# Patient Record
Sex: Female | Born: 1960 | Race: White | Hispanic: No | Marital: Married | Smoking: Never smoker
Health system: Southern US, Community
[De-identification: ages and names within clinical notes are randomized; demographics above are authoritative.]

## PROBLEM LIST (undated history)

## (undated) DIAGNOSIS — E228 Other hyperfunction of pituitary gland: Secondary | ICD-10-CM

## (undated) DIAGNOSIS — E221 Hyperprolactinemia: Secondary | ICD-10-CM

## (undated) DIAGNOSIS — I4719 Other supraventricular tachycardia: Secondary | ICD-10-CM

## (undated) DIAGNOSIS — C4491 Basal cell carcinoma of skin, unspecified: Secondary | ICD-10-CM

## (undated) DIAGNOSIS — Z8249 Family history of ischemic heart disease and other diseases of the circulatory system: Secondary | ICD-10-CM

## (undated) DIAGNOSIS — I471 Supraventricular tachycardia: Secondary | ICD-10-CM

## (undated) DIAGNOSIS — I493 Ventricular premature depolarization: Secondary | ICD-10-CM

## (undated) DIAGNOSIS — E7800 Pure hypercholesterolemia, unspecified: Secondary | ICD-10-CM

## (undated) HISTORY — DX: Family history of ischemic heart disease and other diseases of the circulatory system: Z82.49

## (undated) HISTORY — DX: Supraventricular tachycardia: I47.1

## (undated) HISTORY — PX: OTHER SURGICAL HISTORY: SHX169

## (undated) HISTORY — DX: Pure hypercholesterolemia, unspecified: E78.00

## (undated) HISTORY — DX: Other supraventricular tachycardia: I47.19

## (undated) HISTORY — DX: Other hyperfunction of pituitary gland: E22.8

## (undated) HISTORY — DX: Hyperprolactinemia: E22.1

## (undated) HISTORY — DX: Ventricular premature depolarization: I49.3

## (undated) HISTORY — PX: BASAL CELL CARCINOMA EXCISION: SHX1214

## (undated) HISTORY — DX: Basal cell carcinoma of skin, unspecified: C44.91

---

## 2001-10-16 ENCOUNTER — Encounter: Admission: RE | Admit: 2001-10-16 | Discharge: 2001-10-16 | Payer: Self-pay | Admitting: Family Medicine

## 2001-10-16 ENCOUNTER — Encounter: Payer: Self-pay | Admitting: Family Medicine

## 2002-03-19 ENCOUNTER — Encounter: Payer: Self-pay | Admitting: Family Medicine

## 2002-03-19 ENCOUNTER — Ambulatory Visit (HOSPITAL_COMMUNITY): Admission: RE | Admit: 2002-03-19 | Discharge: 2002-03-19 | Payer: Self-pay | Admitting: Family Medicine

## 2002-04-30 ENCOUNTER — Encounter: Payer: Self-pay | Admitting: Family Medicine

## 2002-04-30 ENCOUNTER — Encounter: Admission: RE | Admit: 2002-04-30 | Discharge: 2002-04-30 | Payer: Self-pay | Admitting: Family Medicine

## 2002-11-19 ENCOUNTER — Encounter: Admission: RE | Admit: 2002-11-19 | Discharge: 2002-11-19 | Payer: Self-pay | Admitting: Family Medicine

## 2002-11-19 ENCOUNTER — Encounter: Payer: Self-pay | Admitting: Family Medicine

## 2003-12-15 ENCOUNTER — Encounter: Admission: RE | Admit: 2003-12-15 | Discharge: 2003-12-15 | Payer: Self-pay | Admitting: Family Medicine

## 2003-12-23 ENCOUNTER — Other Ambulatory Visit: Admission: RE | Admit: 2003-12-23 | Discharge: 2003-12-23 | Payer: Self-pay | Admitting: Family Medicine

## 2004-07-05 ENCOUNTER — Encounter: Admission: RE | Admit: 2004-07-05 | Discharge: 2004-07-05 | Payer: Self-pay | Admitting: Family Medicine

## 2005-01-25 ENCOUNTER — Encounter: Admission: RE | Admit: 2005-01-25 | Discharge: 2005-01-25 | Payer: Self-pay | Admitting: Family Medicine

## 2006-03-07 ENCOUNTER — Encounter: Admission: RE | Admit: 2006-03-07 | Discharge: 2006-03-07 | Payer: Self-pay | Admitting: Family Medicine

## 2006-04-09 ENCOUNTER — Encounter: Admission: RE | Admit: 2006-04-09 | Discharge: 2006-04-09 | Payer: Self-pay | Admitting: Family Medicine

## 2007-04-03 ENCOUNTER — Encounter: Admission: RE | Admit: 2007-04-03 | Discharge: 2007-04-03 | Payer: Self-pay | Admitting: Family Medicine

## 2007-08-05 ENCOUNTER — Other Ambulatory Visit: Admission: RE | Admit: 2007-08-05 | Discharge: 2007-08-05 | Payer: Self-pay | Admitting: Family Medicine

## 2007-09-04 ENCOUNTER — Encounter: Admission: RE | Admit: 2007-09-04 | Discharge: 2007-09-04 | Payer: Self-pay | Admitting: Family Medicine

## 2008-09-16 ENCOUNTER — Other Ambulatory Visit: Admission: RE | Admit: 2008-09-16 | Discharge: 2008-09-16 | Payer: Self-pay | Admitting: Family Medicine

## 2008-09-28 ENCOUNTER — Encounter: Admission: RE | Admit: 2008-09-28 | Discharge: 2008-09-28 | Payer: Self-pay | Admitting: Family Medicine

## 2009-12-08 ENCOUNTER — Encounter: Admission: RE | Admit: 2009-12-08 | Discharge: 2009-12-08 | Payer: Self-pay | Admitting: Family Medicine

## 2010-01-05 ENCOUNTER — Other Ambulatory Visit: Admission: RE | Admit: 2010-01-05 | Discharge: 2010-01-05 | Payer: Self-pay | Admitting: Family Medicine

## 2010-02-23 ENCOUNTER — Encounter: Admission: RE | Admit: 2010-02-23 | Discharge: 2010-02-23 | Payer: Self-pay | Admitting: Family Medicine

## 2010-05-19 ENCOUNTER — Encounter: Payer: Self-pay | Admitting: Family Medicine

## 2010-05-20 ENCOUNTER — Encounter: Payer: Self-pay | Admitting: Family Medicine

## 2012-11-02 ENCOUNTER — Other Ambulatory Visit: Payer: Self-pay

## 2012-11-02 DIAGNOSIS — Z1231 Encounter for screening mammogram for malignant neoplasm of breast: Secondary | ICD-10-CM

## 2012-11-06 ENCOUNTER — Other Ambulatory Visit (HOSPITAL_COMMUNITY)
Admission: RE | Admit: 2012-11-06 | Discharge: 2012-11-06 | Disposition: A | Discharge status: Home / Self Care | Payer: BC Managed Care – PPO | Source: Ambulatory Visit | Attending: Family Medicine | Admitting: Family Medicine

## 2012-11-06 ENCOUNTER — Other Ambulatory Visit: Payer: Self-pay | Admitting: Family Medicine

## 2012-11-06 DIAGNOSIS — Z124 Encounter for screening for malignant neoplasm of cervix: Secondary | ICD-10-CM | POA: Insufficient documentation

## 2012-11-19 ENCOUNTER — Ambulatory Visit: Payer: Self-pay

## 2013-09-03 ENCOUNTER — Ambulatory Visit
Admission: RE | Admit: 2013-09-03 | Discharge: 2013-09-03 | Disposition: A | Discharge status: Home / Self Care | Payer: 59 | Source: Ambulatory Visit

## 2013-09-03 ENCOUNTER — Encounter (INDEPENDENT_AMBULATORY_CARE_PROVIDER_SITE_OTHER): Payer: Self-pay

## 2013-09-03 DIAGNOSIS — Z1231 Encounter for screening mammogram for malignant neoplasm of breast: Secondary | ICD-10-CM

## 2013-10-01 ENCOUNTER — Other Ambulatory Visit: Payer: Self-pay | Admitting: Dermatology

## 2015-06-29 ENCOUNTER — Emergency Department (INDEPENDENT_AMBULATORY_CARE_PROVIDER_SITE_OTHER)
Admission: EM | Admit: 2015-06-29 | Discharge: 2015-06-29 | Disposition: A | Discharge status: Home / Self Care | Payer: 59 | Source: Home / Self Care | Attending: Family Medicine | Admitting: Family Medicine

## 2015-06-29 ENCOUNTER — Encounter (HOSPITAL_COMMUNITY): Payer: Self-pay | Admitting: *Deleted

## 2015-06-29 DIAGNOSIS — S93402A Sprain of unspecified ligament of left ankle, initial encounter: Secondary | ICD-10-CM | POA: Diagnosis not present

## 2015-06-29 NOTE — ED Provider Notes (Signed)
CSN: [id number]     Arrival date & time 06/29/15  1833 History   First MD Initiated Contact with Patient 06/29/15 1948     Chief Complaint  Patient presents with  . Ankle Injury   (Consider location/radiation/quality/duration/timing/severity/associated sxs/prior Treatment) Patient is a 55 y.o. female presenting with lower extremity injury. The history is provided by the patient and the spouse.  Ankle Injury This is a new problem. The current episode started 3 to 5 hours ago. The problem has been gradually improving.    History reviewed. No pertinent past medical history. History reviewed. No pertinent past surgical history. History reviewed. No pertinent family history. Social History  Substance Use Topics  . Smoking status: None  . Smokeless tobacco: None  . Alcohol Use: No   OB History    No data available     Review of Systems  Constitutional: Negative.   Gastrointestinal: Negative.   Musculoskeletal: Positive for joint swelling. Negative for back pain and gait problem.  Skin: Negative.   All other systems reviewed and are negative.   Allergies  Review of patient's allergies indicates no known allergies.  Home Medications   Prior to Admission medications   Medication Sig Start Date End Date Taking? Authorizing Provider  Ibuprofen (MOTRIN PO) Take by mouth.   Yes Historical Provider, MD   Meds Ordered and Administered this Visit  Medications - No data to display  BP 100/68 mmHg  Pulse 65  Temp(Src) 99.9 F (37.7 C) (Oral)  Resp 12  SpO2 99% No data found.   Physical Exam  Constitutional: She is oriented to person, place, and time. She appears well-developed and well-nourished. No distress.  Musculoskeletal: Normal range of motion. She exhibits tenderness.       Left ankle: She exhibits swelling. She exhibits normal range of motion, no ecchymosis, no deformity and normal pulse. Tenderness. Lateral malleolus tenderness found. No medial malleolus, no head of  5th metatarsal and no proximal fibula tenderness found. Achilles tendon normal.       Feet:  Neurological: She is alert and oriented to person, place, and time.  Skin: Skin is warm.  Nursing note and vitals reviewed.   ED Course  Procedures (including critical care time)  Labs Review Labs Reviewed - No data to display  Imaging Review No results found.   Visual Acuity Review  Right Eye Distance:   Left Eye Distance:   Bilateral Distance:    Right Eye Near:   Left Eye Near:    Bilateral Near:         MDM   1. Ankle sprain, left, initial encounter        Billy Fischer, MD 07/02/15 1324

## 2015-06-29 NOTE — ED Notes (Addendum)
Pt  Reports   She    Sustained  An  Injury  To   Her l  Ankle    Today at  approx    5:15 today   She       Twisted  The  Ankle  While  Going  Down  Steps            She  Has  Been applying  Ice  To the  Affected  Ankle          Pt   denys  Any   Other  Injury

## 2015-06-29 NOTE — Discharge Instructions (Signed)
Wear ankle support as needed for comfort, activity as tolerated. advil and ice Acute Ankle Sprain With Phase I Rehab An acute ankle sprain is a partial or complete tear in one or more of the ligaments of the ankle due to traumatic injury. The severity of the injury depends on both the number of ligaments sprained and the grade of sprain. There are 3 grades of sprains.   A grade 1 sprain is a mild sprain. There is a slight pull without obvious tearing. There is no loss of strength, and the muscle and ligament are the correct length.  A grade 2 sprain is a moderate sprain. There is tearing of fibers within the substance of the ligament where it connects two bones or two cartilages. The length of the ligament is increased, and there is usually decreased strength.  A grade 3 sprain is a complete rupture of the ligament and is uncommon. In addition to the grade of sprain, there are three types of ankle sprains.  Lateral ankle sprains: This is a sprain of one or more of the three ligaments on the outer side (lateral) of the ankle. These are the most common sprains. Medial ankle sprains: There is one large triangular ligament of the inner side (medial) of the ankle that is susceptible to injury. Medial ankle sprains are less common. Syndesmosis, "high ankle," sprains: The syndesmosis is the ligament that connects the two bones of the lower leg. Syndesmosis sprains usually only occur with very severe ankle sprains. SYMPTOMS  Pain, tenderness, and swelling in the ankle, starting at the side of injury that may progress to the whole ankle and foot with time.  "Pop" or tearing sensation at the time of injury.  Bruising that may spread to the heel.  Impaired ability to walk soon after injury. CAUSES   Acute ankle sprains are caused by trauma placed on the ankle that temporarily forces or pries the anklebone (talus) out of its normal socket.  Stretching or tearing of the ligaments that normally hold the  joint in place (usually due to a twisting injury). RISK INCREASES WITH:  Previous ankle sprain.  Sports in which the foot may land awkwardly (i.e., basketball, volleyball, or soccer) or walking or running on uneven or rough surfaces.  Shoes with inadequate support to prevent sideways motion when stress occurs.  Poor strength and flexibility.  Poor balance skills.  Contact sports. PREVENTION   Warm up and stretch properly before activity.  Maintain physical fitness:  Ankle and leg flexibility, muscle strength, and endurance.  Cardiovascular fitness.  Balance training activities.  Use proper technique and have a coach correct improper technique.  Taping, protective strapping, bracing, or high-top tennis shoes may help prevent injury. Initially, tape is best; however, it loses most of its support function within 10 to 15 minutes.  Wear proper-fitted protective shoes (High-top shoes with taping or bracing is more effective than either alone).  Provide the ankle with support during sports and practice activities for 12 months following injury. PROGNOSIS   If treated properly, ankle sprains can be expected to recover completely; however, the length of recovery depends on the degree of injury.  A grade 1 sprain usually heals enough in 5 to 7 days to allow modified activity and requires an average of 6 weeks to heal completely.  A grade 2 sprain requires 6 to 10 weeks to heal completely.  A grade 3 sprain requires 12 to 16 weeks to heal.  A syndesmosis sprain often takes more than  3 months to heal. RELATED COMPLICATIONS   Frequent recurrence of symptoms may result in a chronic problem. Appropriately addressing the problem the first time decreases the frequency of recurrence and optimizes healing time. Severity of the initial sprain does not predict the likelihood of later instability.  Injury to other structures (bone, cartilage, or tendon).  A chronically unstable or  arthritic ankle joint is a possibility with repeated sprains. TREATMENT Treatment initially involves the use of ice, medication, and compression bandages to help reduce pain and inflammation. Ankle sprains are usually immobilized in a walking cast or boot to allow for healing. Crutches may be recommended to reduce pressure on the injury. After immobilization, strengthening and stretching exercises may be necessary to regain strength and a full range of motion. Surgery is rarely needed to treat ankle sprains. MEDICATION   Nonsteroidal anti-inflammatory medications, such as aspirin and ibuprofen (do not take for the first 3 days after injury or within 7 days before surgery), or other minor pain relievers, such as acetaminophen, are often recommended. Take these as directed by your caregiver. Contact your caregiver immediately if any bleeding, stomach upset, or signs of an allergic reaction occur from these medications.  Ointments applied to the skin may be helpful.  Pain relievers may be prescribed as necessary by your caregiver. Do not take prescription pain medication for longer than 4 to 7 days. Use only as directed and only as much as you need. HEAT AND COLD  Cold treatment (icing) is used to relieve pain and reduce inflammation for acute and chronic cases. Cold should be applied for 10 to 15 minutes every 2 to 3 hours for inflammation and pain and immediately after any activity that aggravates your symptoms. Use ice packs or an ice massage.  Heat treatment may be used before performing stretching and strengthening activities prescribed by your caregiver. Use a heat pack or a warm soak. SEEK IMMEDIATE MEDICAL CARE IF:   Pain, swelling, or bruising worsens despite treatment.  You experience pain, numbness, discoloration, or coldness in the foot or toes.  New, unexplained symptoms develop (drugs used in treatment may produce side effects.) EXERCISES  PHASE I EXERCISES RANGE OF MOTION (ROM)  AND STRETCHING EXERCISES - Ankle Sprain, Acute Phase I, Weeks 1 to 2 These exercises may help you when beginning to restore flexibility in your ankle. You will likely work on these exercises for the 1 to 2 weeks after your injury. Once your physician, physical therapist, or athletic trainer sees adequate progress, he or she will advance your exercises. While completing these exercises, remember:   Restoring tissue flexibility helps normal motion to return to the joints. This allows healthier, less painful movement and activity.  An effective stretch should be held for at least 30 seconds.  A stretch should never be painful. You should only feel a gentle lengthening or release in the stretched tissue. RANGE OF MOTION - Dorsi/Plantar Flexion  While sitting with your right / left knee straight, draw the top of your foot upwards by flexing your ankle. Then reverse the motion, pointing your toes downward.  Hold each position for __________ seconds.  After completing your first set of exercises, repeat this exercise with your knee bent. Repeat __________ times. Complete this exercise __________ times per day.  RANGE OF MOTION - Ankle Alphabet  Imagine your right / left big toe is a pen.  Keeping your hip and knee still, write out the entire alphabet with your "pen." Make the letters as large as  you can without increasing any discomfort. Repeat __________ times. Complete this exercise __________ times per day.  STRENGTHENING EXERCISES - Ankle Sprain, Acute -Phase I, Weeks 1 to 2 These exercises may help you when beginning to restore strength in your ankle. You will likely work on these exercises for 1 to 2 weeks after your injury. Once your physician, physical therapist, or athletic trainer sees adequate progress, he or she will advance your exercises. While completing these exercises, remember:   Muscles can gain both the endurance and the strength needed for everyday activities through controlled  exercises.  Complete these exercises as instructed by your physician, physical therapist, or athletic trainer. Progress the resistance and repetitions only as guided.  You may experience muscle soreness or fatigue, but the pain or discomfort you are trying to eliminate should never worsen during these exercises. If this pain does worsen, stop and make certain you are following the directions exactly. If the pain is still present after adjustments, discontinue the exercise until you can discuss the trouble with your clinician. STRENGTH - Dorsiflexors  Secure a rubber exercise band/tubing to a fixed object (i.e., table, pole) and loop the other end around your right / left foot.  Sit on the floor facing the fixed object. The band/tubing should be slightly tense when your foot is relaxed.  Slowly draw your foot back toward you using your ankle and toes.  Hold this position for __________ seconds. Slowly release the tension in the band and return your foot to the starting position. Repeat __________ times. Complete this exercise __________ times per day.  STRENGTH - Plantar-flexors   Sit with your right / left leg extended. Holding onto both ends of a rubber exercise band/tubing, loop it around the ball of your foot. Keep a slight tension in the band.  Slowly push your toes away from you, pointing them downward.  Hold this position for __________ seconds. Return slowly, controlling the tension in the band/tubing. Repeat __________ times. Complete this exercise __________ times per day.  STRENGTH - Ankle Eversion  Secure one end of a rubber exercise band/tubing to a fixed object (table, pole). Loop the other end around your foot just before your toes.  Place your fists between your knees. This will focus your strengthening at your ankle.  Drawing the band/tubing across your opposite foot, slowly, pull your little toe out and up. Make sure the band/tubing is positioned to resist the entire  motion.  Hold this position for __________ seconds. Have your muscles resist the band/tubing as it slowly pulls your foot back to the starting position.  Repeat __________ times. Complete this exercise __________ times per day.  STRENGTH - Ankle Inversion  Secure one end of a rubber exercise band/tubing to a fixed object (table, pole). Loop the other end around your foot just before your toes.  Place your fists between your knees. This will focus your strengthening at your ankle.  Slowly, pull your big toe up and in, making sure the band/tubing is positioned to resist the entire motion.  Hold this position for __________ seconds.  Have your muscles resist the band/tubing as it slowly pulls your foot back to the starting position. Repeat __________ times. Complete this exercises __________ times per day.  STRENGTH - Towel Curls  Sit in a chair positioned on a non-carpeted surface.  Place your right / left foot on a towel, keeping your heel on the floor.  Pull the towel toward your heel by only curling your toes. Keep  your heel on the floor.  If instructed by your physician, physical therapist, or athletic trainer, add weight to the end of the towel. Repeat __________ times. Complete this exercise __________ times per day.   This information is not intended to replace advice given to you by your health care provider. Make sure you discuss any questions you have with your health care provider.   Document Released: 11/14/2004 Document Revised: 05/06/2014 Document Reviewed: 07/28/2008 Elsevier Interactive Patient Education [employer].  as needed, return or see orthopedist if further problems.

## 2016-03-08 ENCOUNTER — Other Ambulatory Visit: Payer: Self-pay | Admitting: Family Medicine

## 2016-03-08 DIAGNOSIS — Z1231 Encounter for screening mammogram for malignant neoplasm of breast: Secondary | ICD-10-CM

## 2016-03-29 ENCOUNTER — Other Ambulatory Visit (HOSPITAL_COMMUNITY)
Admission: RE | Admit: 2016-03-29 | Discharge: 2016-03-29 | Disposition: A | Discharge status: Home / Self Care | Payer: 59 | Source: Ambulatory Visit | Attending: Family Medicine | Admitting: Family Medicine

## 2016-03-29 ENCOUNTER — Other Ambulatory Visit: Payer: Self-pay | Admitting: Family Medicine

## 2016-03-29 DIAGNOSIS — E228 Other hyperfunction of pituitary gland: Secondary | ICD-10-CM | POA: Diagnosis not present

## 2016-03-29 DIAGNOSIS — Z0000 Encounter for general adult medical examination without abnormal findings: Secondary | ICD-10-CM | POA: Diagnosis not present

## 2016-03-29 DIAGNOSIS — Z23 Encounter for immunization: Secondary | ICD-10-CM | POA: Diagnosis not present

## 2016-03-29 DIAGNOSIS — Z124 Encounter for screening for malignant neoplasm of cervix: Secondary | ICD-10-CM | POA: Insufficient documentation

## 2016-04-02 LAB — CYTOLOGY - PAP: Diagnosis: NEGATIVE

## 2016-04-12 ENCOUNTER — Ambulatory Visit: Payer: 59

## 2016-04-12 DIAGNOSIS — Z85828 Personal history of other malignant neoplasm of skin: Secondary | ICD-10-CM | POA: Diagnosis not present

## 2016-04-12 DIAGNOSIS — L821 Other seborrheic keratosis: Secondary | ICD-10-CM | POA: Diagnosis not present

## 2016-04-12 DIAGNOSIS — D1801 Hemangioma of skin and subcutaneous tissue: Secondary | ICD-10-CM | POA: Diagnosis not present

## 2016-05-13 ENCOUNTER — Ambulatory Visit
Admission: RE | Admit: 2016-05-13 | Discharge: 2016-05-13 | Disposition: A | Discharge status: Home / Self Care | Payer: BLUE CROSS/BLUE SHIELD | Source: Ambulatory Visit | Attending: Family Medicine | Admitting: Family Medicine

## 2016-05-13 DIAGNOSIS — Z1231 Encounter for screening mammogram for malignant neoplasm of breast: Secondary | ICD-10-CM | POA: Diagnosis not present

## 2017-04-04 DIAGNOSIS — Z23 Encounter for immunization: Secondary | ICD-10-CM | POA: Diagnosis not present

## 2017-04-04 DIAGNOSIS — Z131 Encounter for screening for diabetes mellitus: Secondary | ICD-10-CM | POA: Diagnosis not present

## 2017-04-04 DIAGNOSIS — Z136 Encounter for screening for cardiovascular disorders: Secondary | ICD-10-CM | POA: Diagnosis not present

## 2017-04-04 DIAGNOSIS — Z0000 Encounter for general adult medical examination without abnormal findings: Secondary | ICD-10-CM | POA: Diagnosis not present

## 2017-04-11 DIAGNOSIS — D2239 Melanocytic nevi of other parts of face: Secondary | ICD-10-CM | POA: Diagnosis not present

## 2017-04-11 DIAGNOSIS — L821 Other seborrheic keratosis: Secondary | ICD-10-CM | POA: Diagnosis not present

## 2017-04-11 DIAGNOSIS — D1801 Hemangioma of skin and subcutaneous tissue: Secondary | ICD-10-CM | POA: Diagnosis not present

## 2017-04-11 DIAGNOSIS — Z85828 Personal history of other malignant neoplasm of skin: Secondary | ICD-10-CM | POA: Diagnosis not present

## 2017-06-20 ENCOUNTER — Other Ambulatory Visit: Payer: Self-pay | Admitting: Family Medicine

## 2017-06-20 DIAGNOSIS — Z1231 Encounter for screening mammogram for malignant neoplasm of breast: Secondary | ICD-10-CM

## 2017-06-27 ENCOUNTER — Ambulatory Visit
Admission: RE | Admit: 2017-06-27 | Discharge: 2017-06-27 | Disposition: A | Discharge status: Home / Self Care | Payer: BLUE CROSS/BLUE SHIELD | Source: Ambulatory Visit | Attending: Family Medicine | Admitting: Family Medicine

## 2017-06-27 DIAGNOSIS — Z1231 Encounter for screening mammogram for malignant neoplasm of breast: Secondary | ICD-10-CM

## 2017-11-28 DIAGNOSIS — K649 Unspecified hemorrhoids: Secondary | ICD-10-CM | POA: Diagnosis not present

## 2017-11-28 DIAGNOSIS — Z8371 Family history of colonic polyps: Secondary | ICD-10-CM | POA: Diagnosis not present

## 2017-11-28 DIAGNOSIS — Z1211 Encounter for screening for malignant neoplasm of colon: Secondary | ICD-10-CM | POA: Diagnosis not present

## 2018-03-30 DIAGNOSIS — J018 Other acute sinusitis: Secondary | ICD-10-CM | POA: Diagnosis not present

## 2018-03-30 DIAGNOSIS — Z23 Encounter for immunization: Secondary | ICD-10-CM | POA: Diagnosis not present

## 2018-04-03 DIAGNOSIS — L821 Other seborrheic keratosis: Secondary | ICD-10-CM | POA: Diagnosis not present

## 2018-04-03 DIAGNOSIS — D1722 Benign lipomatous neoplasm of skin and subcutaneous tissue of left arm: Secondary | ICD-10-CM | POA: Diagnosis not present

## 2018-04-03 DIAGNOSIS — D1721 Benign lipomatous neoplasm of skin and subcutaneous tissue of right arm: Secondary | ICD-10-CM | POA: Diagnosis not present

## 2018-04-03 DIAGNOSIS — Z85828 Personal history of other malignant neoplasm of skin: Secondary | ICD-10-CM | POA: Diagnosis not present

## 2019-04-19 DIAGNOSIS — Z0000 Encounter for general adult medical examination without abnormal findings: Secondary | ICD-10-CM | POA: Diagnosis not present

## 2019-05-03 DIAGNOSIS — Z13220 Encounter for screening for lipoid disorders: Secondary | ICD-10-CM | POA: Diagnosis not present

## 2019-05-03 DIAGNOSIS — E221 Hyperprolactinemia: Secondary | ICD-10-CM | POA: Diagnosis not present

## 2019-05-03 DIAGNOSIS — Z23 Encounter for immunization: Secondary | ICD-10-CM | POA: Diagnosis not present

## 2019-05-03 DIAGNOSIS — R634 Abnormal weight loss: Secondary | ICD-10-CM | POA: Diagnosis not present

## 2019-07-05 ENCOUNTER — Ambulatory Visit: Payer: BC Managed Care – PPO | Attending: Internal Medicine

## 2019-07-05 DIAGNOSIS — Z23 Encounter for immunization: Secondary | ICD-10-CM

## 2019-07-05 NOTE — Progress Notes (Signed)
   Covid-19 Vaccination Clinic  Name:  Maria Cowan    MRN: [id number] DOB: May 02, 1960  07/05/2019  [name] was observed post Covid-19 immunization for 15 minutes without incident. She was provided with Vaccine Information Sheet and instruction to access the V-Safe system.   [name] was instructed to call 911 with any severe reactions post vaccine: Marland Kitchen Difficulty breathing  . Swelling of face and throat  . A fast heartbeat  . A bad rash all over body  . Dizziness and weakness   Immunizations Administered    Name Date Dose VIS Date Route   Pfizer COVID-19 Vaccine 07/05/2019  6:29 PM 0.3 mL 04/09/2019 Intramuscular   Manufacturer: [place]   Lot: [id number]   [place]: [id number]

## 2019-07-26 ENCOUNTER — Ambulatory Visit: Payer: BC Managed Care – PPO

## 2019-07-27 ENCOUNTER — Ambulatory Visit: Payer: BC Managed Care – PPO | Attending: Internal Medicine

## 2019-07-27 DIAGNOSIS — Z23 Encounter for immunization: Secondary | ICD-10-CM

## 2019-07-27 NOTE — Progress Notes (Signed)
   Covid-19 Vaccination Clinic  Name:  Maria Cowan    MRN: [id number] DOB: January 29, 1961  07/27/2019  [name] was observed post Covid-19 immunization for 15 minutes without incident. She was provided with Vaccine Information Sheet and instruction to access the V-Safe system.   [name] was instructed to call 911 with any severe reactions post vaccine: Marland Kitchen Difficulty breathing  . Swelling of face and throat  . A fast heartbeat  . A bad rash all over body  . Dizziness and weakness   Immunizations Administered    Name Date Dose VIS Date Route   Pfizer COVID-19 Vaccine 07/27/2019 11:05 AM 0.3 mL 04/09/2019 Intramuscular   Manufacturer: [place]   Lot: [id number]   [place]: [id number]

## 2019-09-07 ENCOUNTER — Other Ambulatory Visit: Payer: Self-pay | Admitting: Family Medicine

## 2019-09-07 DIAGNOSIS — Z1231 Encounter for screening mammogram for malignant neoplasm of breast: Secondary | ICD-10-CM

## 2019-12-24 DIAGNOSIS — Z20822 Contact with and (suspected) exposure to covid-19: Secondary | ICD-10-CM | POA: Diagnosis not present

## 2020-04-19 ENCOUNTER — Other Ambulatory Visit: Payer: Self-pay | Admitting: Family Medicine

## 2020-04-19 DIAGNOSIS — E221 Hyperprolactinemia: Secondary | ICD-10-CM | POA: Diagnosis not present

## 2020-04-19 DIAGNOSIS — Z0000 Encounter for general adult medical examination without abnormal findings: Secondary | ICD-10-CM | POA: Diagnosis not present

## 2020-04-19 DIAGNOSIS — Z13820 Encounter for screening for osteoporosis: Secondary | ICD-10-CM

## 2020-04-19 DIAGNOSIS — Z23 Encounter for immunization: Secondary | ICD-10-CM | POA: Diagnosis not present

## 2020-04-19 DIAGNOSIS — Z124 Encounter for screening for malignant neoplasm of cervix: Secondary | ICD-10-CM | POA: Diagnosis not present

## 2020-04-19 DIAGNOSIS — Z13220 Encounter for screening for lipoid disorders: Secondary | ICD-10-CM | POA: Diagnosis not present

## 2020-06-12 ENCOUNTER — Inpatient Hospital Stay: Admission: RE | Admit: 2020-06-12 | Payer: BC Managed Care – PPO | Source: Ambulatory Visit

## 2020-08-02 ENCOUNTER — Other Ambulatory Visit: Payer: Self-pay | Admitting: Family Medicine

## 2020-08-04 ENCOUNTER — Other Ambulatory Visit: Payer: Self-pay | Admitting: Family Medicine

## 2020-08-04 ENCOUNTER — Other Ambulatory Visit: Payer: BC Managed Care – PPO

## 2020-08-04 DIAGNOSIS — Z1231 Encounter for screening mammogram for malignant neoplasm of breast: Secondary | ICD-10-CM

## 2020-08-04 DIAGNOSIS — Z13820 Encounter for screening for osteoporosis: Secondary | ICD-10-CM

## 2020-11-16 ENCOUNTER — Encounter: Payer: Self-pay | Admitting: Physician Assistant

## 2020-11-16 ENCOUNTER — Telehealth: Payer: BC Managed Care – PPO | Admitting: Physician Assistant

## 2020-11-16 DIAGNOSIS — B029 Zoster without complications: Secondary | ICD-10-CM | POA: Diagnosis not present

## 2020-11-16 MED ORDER — VALACYCLOVIR HCL 1 G PO TABS: 1000.0000 mg | ORAL_TABLET | Freq: Three times a day (TID) | ORAL | 0 refills | Status: DC

## 2020-11-16 NOTE — Patient Instructions (Signed)
Shingles ?Shingles, which is also known as herpes zoster, is an infection that causes a painful skin rash and fluid-filled blisters. It is caused by a virus. ?Shingles only develops in people who: ?Have had chickenpox. ?Have been vaccinated against chickenpox. Shingles is rare in this group. ?What are the causes? ?Shingles is caused by varicella-zoster virus. This is the same virus that causes chickenpox. After a person is exposed to the virus, it stays in the body in an inactive (dormant) state. Shingles develops if the virus is reactivated. This can happen many years after the first (initial) exposure to the virus. It is not known what causes this virus to be reactivated. ?What increases the risk? ?People who have had chickenpox or received the chickenpox vaccine are at risk for shingles. Shingles infection is more common in people who: ?Are older than 62 years of age. ?Have a weakened disease-fighting system (immune system), such as people with: ?HIV (human immunodeficiency virus). ?AIDS (acquired immunodeficiency syndrome). ?Cancer. ?Are taking medicines that weaken the immune system, such as organ transplant medicines. ?Are experiencing a lot of stress. ?What are the signs or symptoms? ?Early symptoms of this condition include itching, tingling, and pain in an area on your skin. Pain may be described as burning, stabbing, or throbbing. ?A few days or weeks after early symptoms start, a painful red rash appears. The rash is usually on one side of the body and has a band-like or belt-like pattern. The rash eventually turns into fluid-filled blisters that break open, change into scabs, and dry up in about 2-3 weeks. ?At any time during the infection, you may also develop: ?A fever. ?Chills. ?A headache. ?Nausea. ?How is this diagnosed? ?This condition is diagnosed with a skin exam. Skin or fluid samples (a culture) may be taken from the blisters before a diagnosis is made. ?How is this treated? ?The rash may last  for several weeks. There is not a specific cure for this condition. Your health care provider may prescribe medicines to help you manage pain, recover more quickly, and avoid long-term problems. Medicines may include: ?Antiviral medicines. ?Anti-inflammatory medicines. ?Pain medicines. ?Anti-itching medicines (antihistamines). ?If the area involved is on your face, you may be referred to a specialist, such as an eye doctor (ophthalmologist) or an ear, nose, and throat (ENT) doctor (otorhinolaryngologist) to help you avoid eye problems, chronic pain, or disability. ?Follow these instructions at home: ?Medicines ?Take over-the-counter and prescription medicines only as told by your health care provider. ?Apply an anti-itch cream or numbing cream to the affected area as told by your health care provider. ?Relieving itching and discomfort ? ?Apply cold, wet cloths (cold compresses) to the area of the rash or blisters as told by your health care provider. ?Cool baths can be soothing. Try adding baking soda or dry oatmeal to the water to reduce itching. Do not bathe in hot water. ?Use calamine lotion as recommended by your health care provider. This is an over-the-counter lotion that helps to relieve itchiness. ?Blister and rash care ?Keep your rash covered with a loose bandage (dressing). Wear loose-fitting clothing to help ease the pain of material rubbing against the rash. ?Wash your hands with soap and water for at least 20 seconds before and after you change your dressing. If soap and water are not available, use hand sanitizer. ?Change your dressing as told by your health care provider. ?Keep your rash and blisters clean by washing the area with mild soap and cool water as told by your health   care provider. ?Check your rash every day for signs of infection. Check for: ?More redness, swelling, or pain. ?Fluid or blood. ?Warmth. ?Pus or a bad smell. ?Do not scratch your rash or pick at your blisters. To help avoid  scratching: ?Keep your fingernails clean and cut short. ?Wear gloves or mittens while you sleep, if scratching is a problem. ?General instructions ?Rest as told by your health care provider. ?Wash your hands often with soap and water for at least 20 seconds. If soap and water are not available, use hand sanitizer. Doing this lowers your chance of getting a bacterial skin infection. ?Before your blisters change into scabs, your shingles infection can cause chickenpox in people who have never had it or have never been vaccinated against it. To prevent this from happening, avoid contact with other people, especially: ?Babies. ?Pregnant women. ?Children who have eczema. ?Older people who have transplants. ?People who have chronic illnesses, such as cancer or AIDS. ?Keep all follow-up visits. This is important. ?How is this prevented? ?Getting vaccinated is the best way to prevent shingles and protect against shingles complications. If you have not been vaccinated, talk with your health care provider about getting the vaccine. ?Where to find more information ?Centers for Disease Control and Prevention: www.cdc.gov ?Contact a health care provider if: ?Your pain is not relieved with prescribed medicines. ?Your pain does not get better after the rash heals. ?You have any of these signs of infection: ?More redness, swelling, or pain around the rash. ?Fluid or blood coming from the rash. ?Warmth coming from your rash. ?Pus or a bad smell coming from the rash. ?A fever. ?Get help right away if: ?The rash is on your face or nose. ?You have facial pain, pain around your eye area, or loss of feeling on one side of your face. ?You have difficulty seeing. ?You have ear pain or have ringing in your ear. ?You have a loss of taste. ?Your condition gets worse. ?Summary ?Shingles, also known as herpes zoster, is an infection that causes a painful skin rash and fluid-filled blisters. ?This condition is diagnosed with a skin exam. Skin or  fluid samples (a culture) may be taken from the blisters. ?Keep your rash covered with a loose bandage (dressing). Wear loose-fitting clothing to help ease the pain of material rubbing against the rash. ?Before your blisters change into scabs, your shingles infection can cause chickenpox in people who have never had it or have never been vaccinated against it. ?This information is not intended to replace advice given to you by your health care provider. Make sure you discuss any questions you have with your health care provider. ?Document Revised: 04/10/2020 Document Reviewed: 04/10/2020 ?Elsevier Patient Education ? 2022 Elsevier [employer] ? ?

## 2020-11-16 NOTE — Progress Notes (Signed)
Virtual Visit Consent   Maria Cowan, you are scheduled for a virtual visit with a [facility] provider today.     Just as with appointments in the office, your consent must be obtained to participate.  Your consent will be active for this visit and any virtual visit you may have with one of our providers in the next 365 days.     If you have a MyChart account, a copy of this consent can be sent to you electronically.  All virtual visits are billed to your insurance company just like a traditional visit in the office.    As this is a virtual visit, video technology does not allow for your provider to perform a traditional examination.  This may limit your provider's ability to fully assess your condition.  If your provider identifies any concerns that need to be evaluated in person or the need to arrange testing (such as labs, EKG, etc.), we will make arrangements to do so.     Although advances in technology are sophisticated, we cannot ensure that it will always work on either your end or our end.  If the connection with a video visit is poor, the visit may have to be switched to a telephone visit.  With either a video or telephone visit, we are not always able to ensure that we have a secure connection.     I need to obtain your verbal consent now.   Are you willing to proceed with your visit today?    Maria Cowan has provided verbal consent on 11/16/2020 for a virtual visit (video or telephone).   Mar Daring, PA-C   Date: 11/16/2020 2:07 PM   Virtual Visit via Video Note   I, Mar Daring, connected with  Maria Cowan  ([id number], 62/05/1960) on 11/16/20 at  2:15 PM EDT by a video-enabled telemedicine application and verified that I am speaking with the correct person using two identifiers.  Location: Patient: Virtual Visit Location: Work; isolated Provider: Virtual Visit Location Provider: Home Office   I discussed the limitations of evaluation and management by  telemedicine and the availability of in person appointments. The patient expressed understanding and agreed to proceed.    History of Present Illness: Maria Cowan is a 60 y.o. who identifies as a female who was assigned female at birth, and is being seen today for possible shingles.  HPI: Rash This is a new problem. The current episode started in the past 7 days (3-4 days ago). The problem has been gradually worsening since onset. The affected locations include the torso (unilateral, left flank to back). The rash is characterized by burning, pain, redness and blistering. She was exposed to nothing. Pertinent negatives include no congestion, cough, diarrhea, fever or shortness of breath. Past treatments include analgesics. The treatment provided no relief. Her past medical history is significant for varicella.     Problems: There are no problems to display for this patient.   Allergies: No Known Allergies Medications:  Current Outpatient Medications:    Ibuprofen (MOTRIN PO), Take by mouth., Disp: , Rfl:   Observations/Objective: Patient is well-developed, well-nourished in no acute distress.  Resting comfortably at work Head is normocephalic, atraumatic.  No labored breathing.  Speech is clear and coherent with logical content.  Patient is alert and oriented at baseline.  Red, papular rash noted in left dermatome along flank  Assessment and Plan: There are no diagnoses linked to this encounter. - Highly suspicious  rash for shingles - Valtrex prescribed - May take tylenol and ibuprofen as needed for pain - Could consider topical lidocaine if pain becomes significant enough - Please call back if nerve pain persists  Follow Up Instructions: I discussed the assessment and treatment plan with the patient. The patient was provided an opportunity to ask questions and all were answered. The patient agreed with the plan and demonstrated an understanding of the instructions.  A copy of  instructions were sent to the patient via MyChart.  The patient was advised to call back or seek an in-person evaluation if the symptoms worsen or if the condition fails to improve as anticipated.  Time:  I spent 18 minutes with the patient via telehealth technology discussing the above problems/concerns.    Mar Daring, PA-C

## 2021-01-19 ENCOUNTER — Other Ambulatory Visit: Payer: Self-pay

## 2021-01-19 ENCOUNTER — Ambulatory Visit
Admission: RE | Admit: 2021-01-19 | Discharge: 2021-01-19 | Disposition: A | Discharge status: Home / Self Care | Payer: BC Managed Care – PPO | Source: Ambulatory Visit | Attending: Family Medicine | Admitting: Family Medicine

## 2021-01-19 DIAGNOSIS — Z1231 Encounter for screening mammogram for malignant neoplasm of breast: Secondary | ICD-10-CM

## 2021-01-19 DIAGNOSIS — M8589 Other specified disorders of bone density and structure, multiple sites: Secondary | ICD-10-CM | POA: Diagnosis not present

## 2021-01-19 DIAGNOSIS — Z13820 Encounter for screening for osteoporosis: Secondary | ICD-10-CM

## 2021-06-13 ENCOUNTER — Other Ambulatory Visit: Payer: Self-pay | Admitting: Family Medicine

## 2021-06-13 DIAGNOSIS — Z0000 Encounter for general adult medical examination without abnormal findings: Secondary | ICD-10-CM | POA: Diagnosis not present

## 2021-06-13 DIAGNOSIS — R69 Illness, unspecified: Secondary | ICD-10-CM

## 2021-06-13 DIAGNOSIS — E228 Other hyperfunction of pituitary gland: Secondary | ICD-10-CM | POA: Diagnosis not present

## 2021-06-13 DIAGNOSIS — I2510 Atherosclerotic heart disease of native coronary artery without angina pectoris: Secondary | ICD-10-CM

## 2021-06-13 DIAGNOSIS — Z13220 Encounter for screening for lipoid disorders: Secondary | ICD-10-CM | POA: Diagnosis not present

## 2021-06-13 DIAGNOSIS — Z23 Encounter for immunization: Secondary | ICD-10-CM | POA: Diagnosis not present

## 2021-06-19 DIAGNOSIS — D1721 Benign lipomatous neoplasm of skin and subcutaneous tissue of right arm: Secondary | ICD-10-CM | POA: Diagnosis not present

## 2021-06-19 DIAGNOSIS — L821 Other seborrheic keratosis: Secondary | ICD-10-CM | POA: Diagnosis not present

## 2021-06-19 DIAGNOSIS — Z85828 Personal history of other malignant neoplasm of skin: Secondary | ICD-10-CM | POA: Diagnosis not present

## 2021-06-19 DIAGNOSIS — D1722 Benign lipomatous neoplasm of skin and subcutaneous tissue of left arm: Secondary | ICD-10-CM | POA: Diagnosis not present

## 2021-07-12 ENCOUNTER — Ambulatory Visit
Admission: RE | Admit: 2021-07-12 | Discharge: 2021-07-12 | Disposition: A | Discharge status: Home / Self Care | Payer: No Typology Code available for payment source | Source: Ambulatory Visit | Attending: Family Medicine | Admitting: Family Medicine

## 2021-07-12 DIAGNOSIS — E7800 Pure hypercholesterolemia, unspecified: Secondary | ICD-10-CM | POA: Diagnosis not present

## 2021-09-05 ENCOUNTER — Other Ambulatory Visit: Payer: Self-pay | Admitting: Cardiology

## 2021-09-05 ENCOUNTER — Ambulatory Visit (INDEPENDENT_AMBULATORY_CARE_PROVIDER_SITE_OTHER): Payer: BC Managed Care – PPO

## 2021-09-05 ENCOUNTER — Ambulatory Visit: Payer: BC Managed Care – PPO | Admitting: Cardiology

## 2021-09-05 ENCOUNTER — Encounter: Payer: Self-pay | Admitting: Cardiology

## 2021-09-05 VITALS — BP 118/72 | HR 64 | Ht 65.0 in | Wt 132.0 lb

## 2021-09-05 DIAGNOSIS — R002 Palpitations: Secondary | ICD-10-CM

## 2021-09-05 DIAGNOSIS — R931 Abnormal findings on diagnostic imaging of heart and coronary circulation: Secondary | ICD-10-CM | POA: Diagnosis not present

## 2021-09-05 DIAGNOSIS — E7800 Pure hypercholesterolemia, unspecified: Secondary | ICD-10-CM

## 2021-09-05 DIAGNOSIS — E785 Hyperlipidemia, unspecified: Secondary | ICD-10-CM | POA: Diagnosis not present

## 2021-09-05 DIAGNOSIS — R9431 Abnormal electrocardiogram [ECG] [EKG]: Secondary | ICD-10-CM

## 2021-09-05 HISTORY — DX: Abnormal findings on diagnostic imaging of heart and coronary circulation: R93.1

## 2021-09-05 MED ORDER — METOPROLOL TARTRATE 50 MG PO TABS: 50.0000 mg | ORAL_TABLET | Freq: Once | ORAL | 0 refills | Status: DC

## 2021-09-05 NOTE — Progress Notes (Signed)
? ?Cardiology CONSULT Note   ? ?Date:  09/05/2021  ? ?ID:  Maria Cowan, DOB 1960/11/27, MRN [id number] ? ?PCP:  Kelton Pillar, MD  ?Cardiologist:  Fransico Him, MD  ? ?Chief Complaint  ?Patient presents with  ? New Patient (Initial Visit)  ?  Evaluation of coronary artery calcium  ? ? ?History of Present Illness:  ?Maria Cowan is a 61 y.o. female who is being seen today for the evaluation of coronary artery calcium at the request of Pahwani, Michell Heinrich, MD. ? ?This is a 61 year old female with a history of hyperlipidemia and family history of premature CAD who recently underwent restratification with coronary calcium score that was elevated at 111.  She is now referred for further evaluation.  Her father died at 71 of CAD.  Her mother also has CAD and received several stents in the past starting in her 26s.  Her mom had atypical symptoms with her CAD.  Her paternal grandfather had CAD as well and she has several cousins on her father side who had MIs and CAD diagnosed in their 34s..  She denies any chest pain or pressure, shortness of breath, dyspnea on exertion, lower extremity edema, PND, orthopnea, dizziness or syncope.  She has had some issues with palpitations in the past lasting a few minutes at a time.  She thinks her father may have had atrial fibrillation.  She does tell me that intermittently she will get shoulder pain on the left side that radiates up into her face with some tingling in her face.  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  It can occur at rest or with exertion. ? ?Past Medical History:  ?Diagnosis Date  ? Agatston coronary artery calcium score between 100 and 400 09/05/2021  ? Basal cell carcinoma   ? Elevated cholesterol   ? Family history of coronary arteriosclerosis   ? Hyperprolactinemia ([place])   ? 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 ? ?Past Surgical History:  ?Procedure Laterality Date  ? BASAL CELL CARCINOMA EXCISION    ? PITUITARY ADENOMA    ? ? ?Current  Medications: ?Current Meds  ?Medication Sig  ? atorvastatin (LIPITOR) 20 MG tablet Take 20 mg by mouth daily.  ? Calcium Carbonate Antacid (CALCIUM CARBONATE PO) Take 1,500 mg by mouth.  ? fluticasone (FLONASE) 50 MCG/ACT nasal spray Place 1 spray into both nostrils daily.  ? metoprolol tartrate (LOPRESSOR) 50 MG tablet Take 1 tablet (50 mg total) by mouth once for 1 dose. Take 1 tablet (50 mg total) two hours prior to CT scan.  ? valACYclovir (VALTREX) 1000 MG tablet Take 1 tablet (1,000 mg total) by mouth 3 (three) times daily.  ? VITAMIN D, CHOLECALCIFEROL, PO Take 25 mg by mouth.  ? ? ?Allergies:   Patient has no known allergies.  ? ?Social History  ? ?Socioeconomic History  ? Marital status: Married  ?  Spouse name: Not on file  ? Number of children: Not on file  ? Years of education: Not on file  ? Highest education level: Not on file  ?Occupational History  ? Not on file  ?Tobacco Use  ? Smoking status: Never  ? Smokeless tobacco: Not on file  ?Substance and Sexual Activity  ? Alcohol use: No  ? Drug use: Never  ? Sexual activity: Not on file  ?Other Topics Concern  ? Not on file  ?Social History Narrative  ? Not on file  ? ?Social Determinants  of Health  ? ?Financial Resource Strain: Not on file  ?Food Insecurity: Not on file  ?Transportation Needs: Not on file  ?Physical Activity: Not on file  ?Stress: Not on file  ?Social Connections: Not on file  ?  ? ?Family History:  The patient's family history includes CAD in her father, mother, and paternal grandfather; Colon polyps in her mother; Hyperlipidemia in her mother; Macular degeneration in her mother.  ? ?ROS:   ?Please see the history of present illness.    ?ROS All other systems reviewed and are negative. ? ?   ? View : No data to display.  ?  ?  ?  ? ? ? ? ? ?PHYSICAL EXAM:   ?VS:  BP 118/72   Pulse 64   Ht '5\' 5"'$  (1.651 m)   Wt 132 lb (59.9 kg)   SpO2 98%   BMI 21.97 kg/m?    ?GEN: Well nourished, well developed, in no acute distress  ?HEENT:  normal  ?Neck: no JVD, carotid bruits, or masses ?Cardiac: RRR; no murmurs, rubs, or gallops,no edema.  Intact distal pulses bilaterally.  ?Respiratory:  clear to auscultation bilaterally, normal work of breathing ?GI: soft, nontender, nondistended, + BS ?MS: no deformity or atrophy  ?Skin: warm and dry, no rash ?Neuro:  Alert and Oriented x 3, Strength and sensation are intact ?Psych: euthymic mood, full affect ? ?Wt Readings from Last 3 Encounters:  ?09/05/21 132 lb (59.9 kg)  ?  ? ? ?Studies/Labs Reviewed:  ? ?EKG:  EKG is ordered today.  The ekg ordered today demonstrates normal sinus rhythm with nonspecific ST abnormality in the inferior lateral leads ? ?Recent Labs: ?No results found for requested labs within last 8760 hours.  ? ?Lipid Panel ?No results found for: CHOL, TRIG, HDL, CHOLHDL, VLDL, LDLCALC, LDLDIRECT ? ? ?ASSESSMENT:   ? ?1. Agatston coronary artery calcium score between 100 and 199   ?2. Hyperlipidemia LDL goal <70   ?3. Pure hypercholesterolemia   ?4. Palpitations   ? ? ? ?PLAN:  ?In order of problems listed above: ? ?Coronary artery calcification ?-Coronary calcium score was 111 which was 85th percentile for age gender and ethnicity. ?-She has not had any chest pain but intermittently will have a vague discomfort in her left arm that radiates up into her neck and face with some tingling in her face.  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  It occurs more often in the morning when she first gets up to move around. ?-Her EKG shows nonspecific ST abnormality in the inferior lateral leads ?-Given her vague symptoms in her left shoulder neck and jaw as well as nonspecific EKG and known coronary calcium, I recommended proceeding with coronary CTA to rule out obstructive CAD. ?-Check 2D echo to make sure LV function is preserved ?-She needs aggressive risk factor modification and preventative therapy ?-We will strive for an LDL goal less than 70.  Her blood pressure is controlled. ?-Check hemoglobin  A1c ?-She was counseled on symptoms of angina and to let me know if she develops any symptoms of chest pain or pressure, shortness of breath or exertional fatigue ? ?2.  Hyperlipidemia ?-LDL goal less than 70 ?-I have personally reviewed and interpreted outside labs performed by patient's PCP which showed LDL 161, HDL 115, triglycerides 49 and ALT 13 on 06/13/2021 ?-She was started on atorvastatin 20 mg daily in February ?-Continue prescription drug management with atorvastatin 20 mg daily for now with as needed refills ?-Repeat FLP  and ALT ? ?3.  Palpitations ?-She had some extra beats at times on her physical exam today although EKG did not show any arrhythmias ?-get a 2-week Zio patch to make sure she is not having any PAF ?Follow-up with me in 1 year if her studies are normal ? ?Time Spent: ?20 minutes total time of encounter, including 15 minutes spent in face-to-face patient care on the date of this encounter. This time includes coordination of care and counseling regarding above mentioned problem list. Remainder of non-face-to-face time involved reviewing chart documents/testing relevant to the patient encounter and documentation in the medical record. I have independently reviewed documentation from referring provider ? ?Medication Adjustments/Labs and Tests Ordered: ?Current medicines are reviewed at length with the patient today.  Concerns regarding medicines are outlined above.  Medication changes, Labs and Tests ordered today are listed in the Patient Instructions below. ? ?Patient Instructions  ?Medication Instructions:  ?Your physician recommends that you continue on your current medications as directed. Please refer to the Current Medication list given to you today. ? ?*If you need a refill on your cardiac medications before your next appointment, please call your pharmacy* ? ?Lab Work: ?Come back fasting for lipids and ALT ? ?If you have labs (blood work) drawn today and your tests are completely  normal, you will receive your results only by: ?MyChart Message (if you have MyChart) OR ?A paper copy in the mail ?If you have any lab test that is abnormal or we need to change your treatment, we will call you to revi

## 2021-09-05 NOTE — Patient Instructions (Addendum)
Medication Instructions:  ?Your physician recommends that you continue on your current medications as directed. Please refer to the Current Medication list given to you today. ? ?*If you need a refill on your cardiac medications before your next appointment, please call your pharmacy* ? ?Lab Work: ?Come back fasting for lipids and ALT ? ?If you have labs (blood work) drawn today and your tests are completely normal, you will receive your results only by: ?MyChart Message (if you have MyChart) OR ?A paper copy in the mail ?If you have any lab test that is abnormal or we need to change your treatment, we will call you to review the results. ? ? ?Testing/Procedures: ?Your physician has requested that you have an echocardiogram. Echocardiography is a painless test that uses sound waves to create images of your heart. It provides your doctor with information about the size and shape of your heart and how well your heart?s chambers and valves are working. This procedure takes approximately one hour. There are no restrictions for this procedure. ? ?Your physician has requested that you have a coronary CTA scan done. Please see next page for further instructions.  ? ?Your physician has recommended that you wear an event monitor. Event monitors are medical devices that record the heart?s electrical activity. Doctors most often [place] these monitors to diagnose arrhythmias. Arrhythmias are problems with the speed or rhythm of the heartbeat. The monitor is a small, portable device. You can wear one while you do your normal daily activities. This is usually used to diagnose what is causing palpitations/syncope (passing out). ? ?Follow-Up: ?At [facility], you and your health needs are our priority.  As part of our continuing mission to provide you with exceptional heart care, we have created designated Provider Care Teams.  These Care Teams include your primary Cardiologist (physician) and Advanced Practice Providers (APPs -   Physician Assistants and Nurse Practitioners) who all work together to provide you with the care you need, when you need it. ? ?Your next appointment:   ?1 year(s) ? ?The format for your next appointment:   ?In Person ? ?Provider:   ?Fransico Him, MD   ? ? ?Other Instructions ? ? ?Your cardiac CT will be scheduled at:  ? ?[facility] ?[address] ?[place], [place] [address] ?[phone] ? ?If scheduled at [facility], please arrive at the [facility] and Children's Entrance (Entrance C2) of [facility] 30 minutes prior to test start time. ?You can use the FREE valet parking offered at entrance C (encouraged to control the heart rate for the test)  ?Proceed to the [facility] Radiology Department (first floor) to check-in and test prep. ? ?All radiology patients and guests should use entrance C2 at [facility], accessed from [facility], even though the hospital's physical address listed is [address] ? ? ? ? ?Please follow these instructions carefully (unless otherwise directed): ? ?Hold all erectile dysfunction medications at least 3 days (72 hrs) prior to test. ? ?On the Night Before the Test: ?Be sure to Drink plenty of water. ?Do not consume any caffeinated/decaffeinated beverages or chocolate 12 hours prior to your test. ?Do not take any antihistamines 12 hours prior to your test. ? ?On the Day of the Test: ?Drink plenty of water until 1 hour prior to the test. ?Do not eat any food 4 hours prior to the test. ?You may take your regular medications prior to the test.  ?Take metoprolol (Lopressor)  two hours prior to test. ?FEMALES- please wear underwire-free bra if available, avoid dresses & tight clothing ?     ?After the Test: ?Drink plenty of water. ?After receiving IV contrast, you may experience a mild flushed feeling. This is normal. ?On occasion, you may experience a mild rash up to 24 hours after the test. This is not dangerous. If this occurs,  you can take Benadryl 25 mg and increase your fluid intake. ?If you experience trouble breathing, this can be serious. If it is severe call 911 IMMEDIATELY. If it is mild, please call our office. ?If you take any of these medications: Glipizide/Metformin, Avandament, Glucavance, please do not take 48 hours after completing test unless otherwise instructed. ? ?We will call to schedule your test 2-4 weeks out understanding that some insurance companies will need an authorization prior to the service being performed.  ? ?For non-scheduling related questions, please contact the cardiac imaging nurse navigator should you have any questions/concerns: ?Marchia Bond, Cardiac Imaging Nurse Navigator ?Gordy Clement, Cardiac Imaging Nurse Navigator ?[facility] ?Direct Office Dial: [phone]  ? ?For scheduling needs, including cancellations and rescheduling, please call [place], [phone].  ? ?ZIO XT- Long Term Monitor Instructions ? ?Your physician has requested you wear a ZIO patch monitor for 14 days.  ?This is a single patch monitor. [employer] supplies one patch monitor per enrollment. Additional ?stickers are not available. Please do not apply patch if you will be having a Nuclear Stress Test,  ?Echocardiogram, Cardiac CT, MRI, or Chest Xray during the period you would be wearing the  ?monitor. The patch cannot be worn during these tests. You cannot remove and re-apply the  ?ZIO XT patch monitor.  ?Your ZIO patch monitor will be mailed 3 day USPS to your address on file. It may take 3-5 days  ?to receive your monitor after you have been enrolled.  ?Once you have received your monitor, please review the enclosed instructions. Your monitor  ?has already been registered assigning a specific monitor serial # to you. ? ?Billing and Patient Assistance Program Information ? ?We have supplied [employer] with any of your insurance information on file for billing purposes. ?[employer] offers a sliding  scale Patient Assistance Program for patients that do not have  ?insurance, or whose insurance does not completely cover the cost of the ZIO monitor.  ?You must apply for the Patient Assistance Program to qualify for this discounted rate.  ?To apply, please call [employer] at [phone], select option 4, select option 2, ask to apply for  ?Patient Assistance Program. Theodore Demark will ask your household income, and how many people  ?are in your household. They will quote your out-of-pocket cost based on that information.  ?[employer] will also be able to set up a 40-month interest-free payment plan if needed. ? ?Applying the monitor ?  ?Shave hair from upper left chest.  ?Hold abrader disc by orange tab. Rub abrader in 40 strokes over the upper left chest as  ?indicated in your monitor instructions.  ?Clean area with 4 enclosed alcohol pads. Let dry.  ?Apply patch as indicated in monitor instructions. Patch will be placed under collarbone on left  ?side of chest with arrow pointing upward.  ?Rub patch adhesive wings for 2 minutes. Remove white label marked "1". Remove the white  ?label marked "2". Rub patch adhesive wings for 2 additional minutes.  ?While looking in a mirror, press and release button in center of patch. A small green light will  ?  flash 3-4 times. This will be your only indicator that the monitor has been turned on.  ?Do not shower for the first 24 hours. You may shower after the first 24 hours.  ?Press the button if you feel a symptom. You will hear a small click. Record Date, Time and  ?Symptom in the Patient Logbook.  ?When you are ready to remove the patch, follow instructions on the last 2 pages of Patient  ?Logbook. Stick patch monitor onto the last page of Patient Logbook.  ?Place Patient Logbook in the blue and white box. Use locking tab on box and tape box closed  ?securely. The blue and white box has prepaid postage on it. Please place it in the mailbox as  ?soon as possible. Your physician should  have your test results approximately 7 days after the  ?monitor has been mailed back to [facility].  ?Call [facility] at [phone] if you have questions regarding  ?your ZIO XT pa

## 2021-09-05 NOTE — Progress Notes (Unsigned)
Enrolled for [employer] to mail a ZIO XT long term holter monitor to the patients address on file.  

## 2021-09-26 ENCOUNTER — Other Ambulatory Visit: Payer: BC Managed Care – PPO | Admitting: *Deleted

## 2021-09-26 ENCOUNTER — Ambulatory Visit (HOSPITAL_COMMUNITY): Payer: BC Managed Care – PPO | Attending: Cardiology

## 2021-09-26 DIAGNOSIS — R9431 Abnormal electrocardiogram [ECG] [EKG]: Secondary | ICD-10-CM

## 2021-09-26 DIAGNOSIS — E7800 Pure hypercholesterolemia, unspecified: Secondary | ICD-10-CM

## 2021-09-26 DIAGNOSIS — R931 Abnormal findings on diagnostic imaging of heart and coronary circulation: Secondary | ICD-10-CM | POA: Insufficient documentation

## 2021-09-26 DIAGNOSIS — E785 Hyperlipidemia, unspecified: Secondary | ICD-10-CM | POA: Diagnosis not present

## 2021-09-26 LAB — HEMOGLOBIN A1C
Est. average glucose Bld gHb Est-mCnc: 111 mg/dL
Hgb A1c MFr Bld: 5.5 % (ref 4.8–5.6)

## 2021-09-26 LAB — BASIC METABOLIC PANEL
BUN/Creatinine Ratio: 15 (ref 12–28)
BUN: 13 mg/dL (ref 8–27)
CO2: 25 mmol/L (ref 20–29)
Calcium: 9.7 mg/dL (ref 8.7–10.3)
Chloride: 100 mmol/L (ref 96–106)
Creatinine, Ser: 0.85 mg/dL (ref 0.57–1.00)
Glucose: 97 mg/dL (ref 70–99)
Potassium: 4.3 mmol/L (ref 3.5–5.2)
Sodium: 138 mmol/L (ref 134–144)
eGFR: 77 mL/min/{1.73_m2} (ref >59)

## 2021-09-26 LAB — LIPID PANEL
Chol/HDL Ratio: 1.7 ratio (ref 0.0–4.4)
Cholesterol, Total: 210 mg/dL — ABNORMAL HIGH (ref 100–199)
HDL: 123 mg/dL (ref >39)
LDL Chol Calc (NIH): 78 mg/dL (ref 0–99)
Triglycerides: 45 mg/dL (ref 0–149)
VLDL Cholesterol Cal: 9 mg/dL (ref 5–40)

## 2021-09-26 LAB — ALT: ALT: 20 IU/L (ref 0–32)

## 2021-09-26 LAB — ECHOCARDIOGRAM COMPLETE
Area-P 1/2: 3.69 cm2
S' Lateral: 3 cm

## 2021-09-28 ENCOUNTER — Telehealth: Payer: Self-pay | Admitting: *Deleted

## 2021-09-28 ENCOUNTER — Telehealth: Payer: Self-pay | Admitting: Cardiology

## 2021-09-28 DIAGNOSIS — E785 Hyperlipidemia, unspecified: Secondary | ICD-10-CM

## 2021-09-28 DIAGNOSIS — Z79899 Other long term (current) drug therapy: Secondary | ICD-10-CM

## 2021-09-28 NOTE — Telephone Encounter (Signed)
Patient was returning call for results. Please advise °

## 2021-09-28 NOTE — Telephone Encounter (Signed)
-----   Message from Sueanne Margarita, MD sent at 09/27/2021 10:00 AM EDT ----- LDL still not at goal of less than 70.  Please increase atorvastatin to 40 mg daily and repeat FLP and ALT in 6 weeks

## 2021-09-28 NOTE — Telephone Encounter (Signed)
Informed patient of results and verbal understanding expressed. Rx sent to pharmacy on file Pt will stop by office on 7/14 for fasting blood work

## 2021-09-28 NOTE — Telephone Encounter (Signed)
See lab result for further documentation on this

## 2021-10-03 DIAGNOSIS — R002 Palpitations: Secondary | ICD-10-CM | POA: Diagnosis not present

## 2021-10-19 ENCOUNTER — Telehealth (HOSPITAL_COMMUNITY): Payer: Self-pay | Admitting: Emergency Medicine

## 2021-10-19 NOTE — Telephone Encounter (Signed)
Attempted to call patient regarding upcoming cardiac CT appointment. °Left message on voicemail with name and callback number °Dwan Fennel RN Navigator Cardiac Imaging °[facility] °[phone] Office °[phone] Cell ° °

## 2021-10-22 ENCOUNTER — Ambulatory Visit (HOSPITAL_COMMUNITY): Admission: RE | Admit: 2021-10-22 | Payer: BC Managed Care – PPO | Source: Ambulatory Visit

## 2021-10-25 ENCOUNTER — Encounter: Payer: Self-pay | Admitting: Cardiology

## 2021-10-25 DIAGNOSIS — I493 Ventricular premature depolarization: Secondary | ICD-10-CM | POA: Insufficient documentation

## 2021-10-25 DIAGNOSIS — I4719 Other supraventricular tachycardia: Secondary | ICD-10-CM | POA: Insufficient documentation

## 2021-10-25 DIAGNOSIS — I471 Supraventricular tachycardia: Secondary | ICD-10-CM | POA: Insufficient documentation

## 2021-11-02 ENCOUNTER — Telehealth: Payer: Self-pay | Admitting: Cardiology

## 2021-11-02 MED ORDER — ATORVASTATIN CALCIUM 40 MG PO TABS: 40.0000 mg | ORAL_TABLET | Freq: Every day | ORAL | 3 refills | Status: DC

## 2021-11-02 NOTE — Telephone Encounter (Signed)
Left message for patient to call back  

## 2021-11-02 NOTE — Telephone Encounter (Signed)
-----   Message from Sueanne Margarita, MD sent at 10/25/2021  4:12 PM EDT ----- Heart monitor showed extra heart beats from top and bottom of heart.  Please find out how symptomatic she is.

## 2021-11-02 NOTE — Telephone Encounter (Signed)
The patient has been notified of the result and verbalized understanding.  All questions (if any) were answered. Antonieta Iba, RN 11/02/2021 9:10 AM  Patient reports that she is not symptomatic.

## 2021-11-02 NOTE — Telephone Encounter (Signed)
Pt returning nurses [facility]) regarding results. Please advise

## 2021-11-02 NOTE — Telephone Encounter (Signed)
Pt returning a call from [place], [place].

## 2021-11-09 ENCOUNTER — Other Ambulatory Visit: Payer: BC Managed Care – PPO

## 2021-11-09 DIAGNOSIS — E785 Hyperlipidemia, unspecified: Secondary | ICD-10-CM

## 2021-11-09 DIAGNOSIS — Z79899 Other long term (current) drug therapy: Secondary | ICD-10-CM | POA: Diagnosis not present

## 2021-11-09 LAB — LIPID PANEL
Chol/HDL Ratio: 1.7 ratio (ref 0.0–4.4)
Cholesterol, Total: 185 mg/dL (ref 100–199)
HDL: 107 mg/dL (ref >39)
LDL Chol Calc (NIH): 69 mg/dL (ref 0–99)
Triglycerides: 42 mg/dL (ref 0–149)
VLDL Cholesterol Cal: 9 mg/dL (ref 5–40)

## 2021-11-14 ENCOUNTER — Telehealth: Payer: Self-pay | Admitting: Cardiology

## 2021-11-14 NOTE — Telephone Encounter (Signed)
*  STAT* If patient is at the pharmacy, call can be transferred to refill team.   1. Which medications need to be refilled? (please list name of each medication and dose if known)  atorvastatin (LIPITOR) 10 MG tablet  2. Which pharmacy/location (including street and city if local pharmacy) is medication to be sent to? [id number] [place] [address]. Do they need a 30 day or 90 day supply?  90 day supply

## 2021-11-15 NOTE — Telephone Encounter (Signed)
Prescription was sent to requested pharmacy on 11/02/21. Pt made aware.

## 2021-11-26 ENCOUNTER — Telehealth: Payer: Self-pay

## 2021-11-26 MED ORDER — ATORVASTATIN CALCIUM 10 MG PO TABS: 10.0000 mg | ORAL_TABLET | Freq: Every day | ORAL | 3 refills | Status: DC

## 2021-11-26 NOTE — Telephone Encounter (Signed)
Spoke with the patient who states that she is not able to tolerate the 40 mg of atorvastatin. She has been taking a 20 mg tablet plus an extra 10 mg tablet for a total of 30 mg daily.

## 2021-12-01 ENCOUNTER — Encounter (HOSPITAL_COMMUNITY): Payer: Self-pay

## 2021-12-10 ENCOUNTER — Other Ambulatory Visit: Payer: Self-pay | Admitting: Family Medicine

## 2021-12-10 DIAGNOSIS — Z1231 Encounter for screening mammogram for malignant neoplasm of breast: Secondary | ICD-10-CM

## 2022-01-08 ENCOUNTER — Other Ambulatory Visit: Payer: Self-pay | Admitting: Internal Medicine

## 2022-01-08 DIAGNOSIS — R918 Other nonspecific abnormal finding of lung field: Secondary | ICD-10-CM

## 2022-01-18 DIAGNOSIS — E7800 Pure hypercholesterolemia, unspecified: Secondary | ICD-10-CM | POA: Diagnosis not present

## 2022-01-21 ENCOUNTER — Ambulatory Visit: Payer: BC Managed Care – PPO

## 2022-02-01 ENCOUNTER — Other Ambulatory Visit: Payer: BC Managed Care – PPO

## 2022-02-01 ENCOUNTER — Ambulatory Visit
Admission: RE | Admit: 2022-02-01 | Discharge: 2022-02-01 | Disposition: A | Discharge status: Home / Self Care | Payer: BC Managed Care – PPO | Source: Ambulatory Visit | Attending: Internal Medicine | Admitting: Internal Medicine

## 2022-02-01 DIAGNOSIS — R911 Solitary pulmonary nodule: Secondary | ICD-10-CM | POA: Diagnosis not present

## 2022-02-01 DIAGNOSIS — R918 Other nonspecific abnormal finding of lung field: Secondary | ICD-10-CM

## 2022-02-01 DIAGNOSIS — I700 Atherosclerosis of aorta: Secondary | ICD-10-CM | POA: Diagnosis not present

## 2022-03-15 ENCOUNTER — Ambulatory Visit
Admission: RE | Admit: 2022-03-15 | Discharge: 2022-03-15 | Disposition: A | Discharge status: Home / Self Care | Payer: BC Managed Care – PPO | Source: Ambulatory Visit | Attending: Family Medicine | Admitting: Family Medicine

## 2022-03-15 DIAGNOSIS — Z1231 Encounter for screening mammogram for malignant neoplasm of breast: Secondary | ICD-10-CM

## 2022-09-05 DIAGNOSIS — I2510 Atherosclerotic heart disease of native coronary artery without angina pectoris: Secondary | ICD-10-CM | POA: Diagnosis not present

## 2022-09-05 DIAGNOSIS — I700 Atherosclerosis of aorta: Secondary | ICD-10-CM | POA: Diagnosis not present

## 2022-09-05 DIAGNOSIS — Z124 Encounter for screening for malignant neoplasm of cervix: Secondary | ICD-10-CM | POA: Diagnosis not present

## 2022-09-05 DIAGNOSIS — Z0000 Encounter for general adult medical examination without abnormal findings: Secondary | ICD-10-CM | POA: Diagnosis not present

## 2022-09-05 DIAGNOSIS — E7800 Pure hypercholesterolemia, unspecified: Secondary | ICD-10-CM | POA: Diagnosis not present

## 2022-09-20 IMAGING — MG MM DIGITAL SCREENING BILAT W/ TOMO AND CAD
8 series · 9 of 24 positions shown · non-contrast
Comparison: Previous exam(s).

CLINICAL DATA: Screening.

EXAM:
DIGITAL SCREENING BILATERAL MAMMOGRAM WITH TOMOSYNTHESIS AND CAD
TECHNIQUE: Bilateral screening digital craniocaudal and mediolateral oblique
mammograms were obtained. Bilateral screening digital breast
tomosynthesis was performed. The images were evaluated with
computer-aided detection.

[L CC synth-2D]
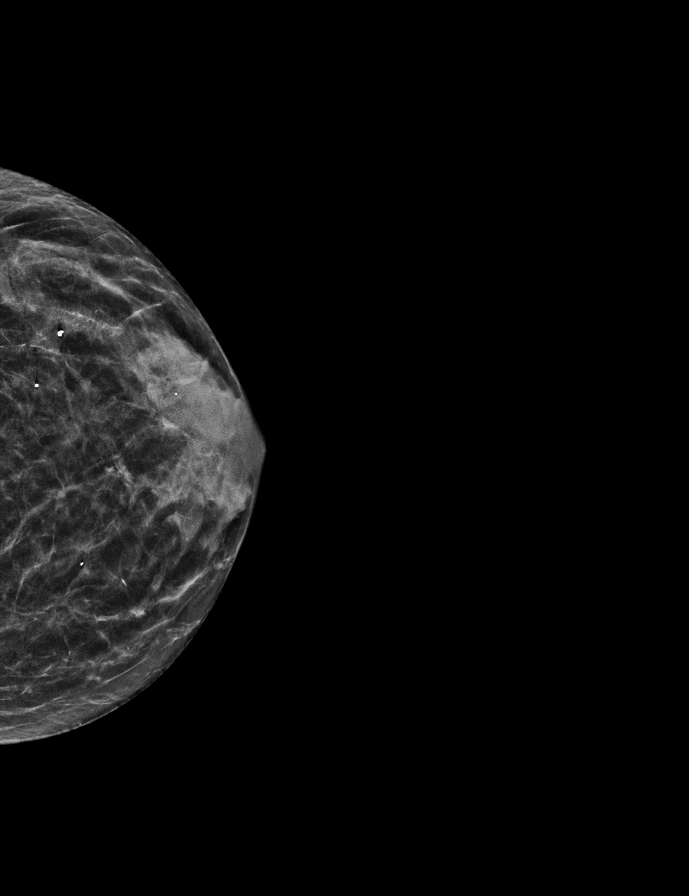

[L MLO synth-2D]
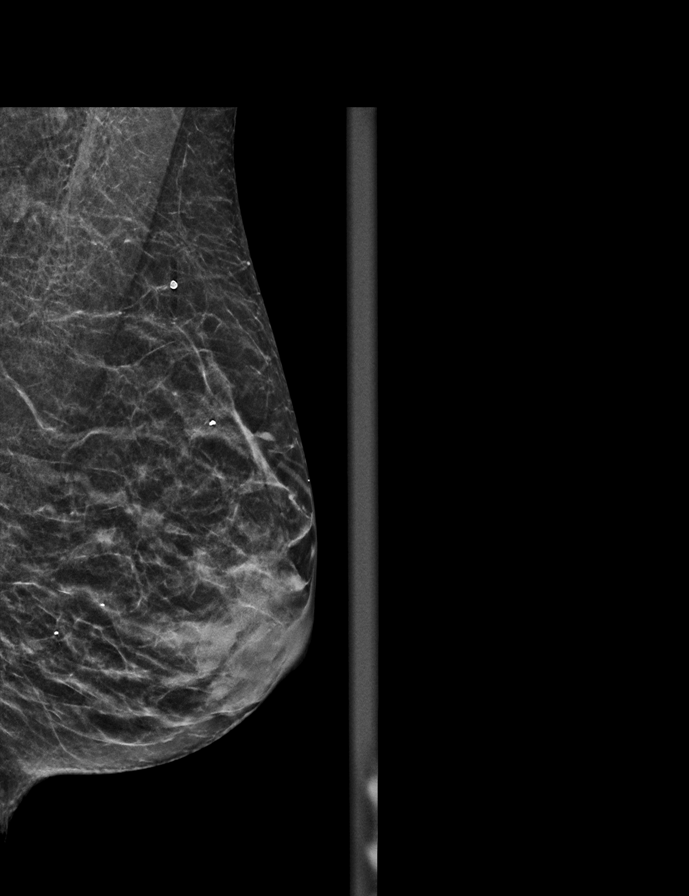

[R CC synth-2D]
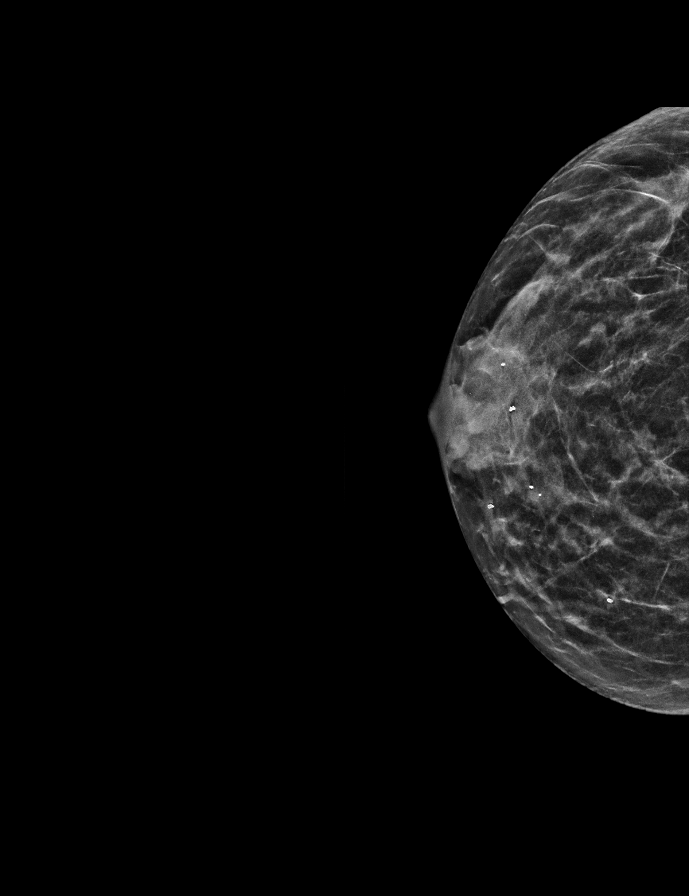

[R MLO synth-2D]
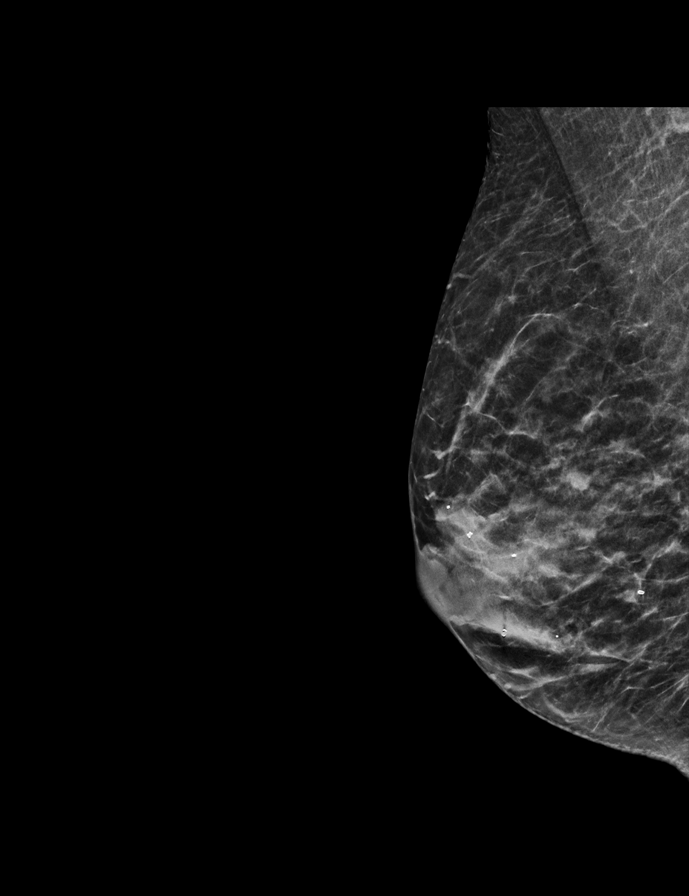

[R MLO tomo · 2 of 45 frames shown]
[frame 15/45]
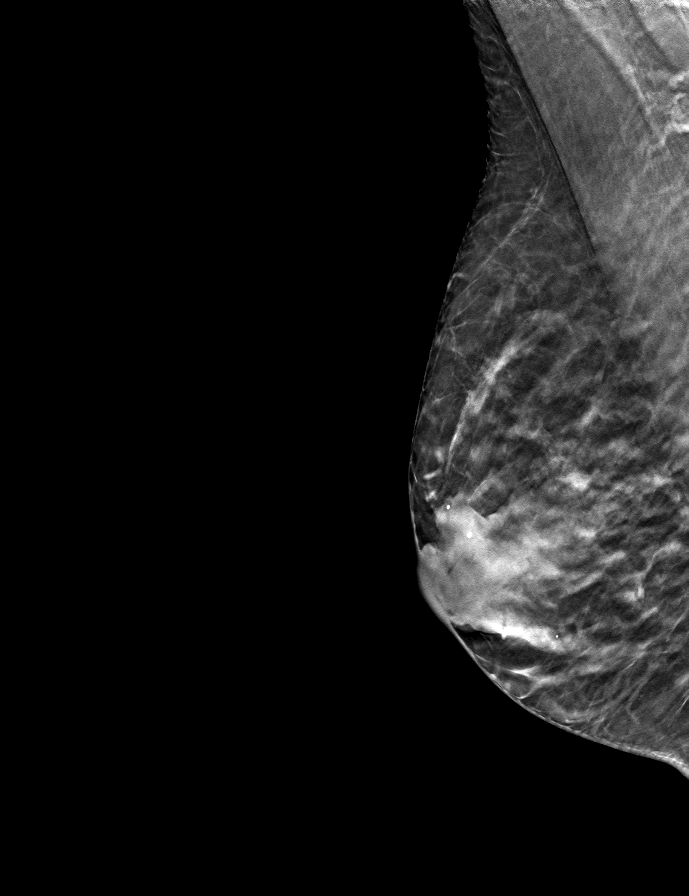
[frame 23/45]
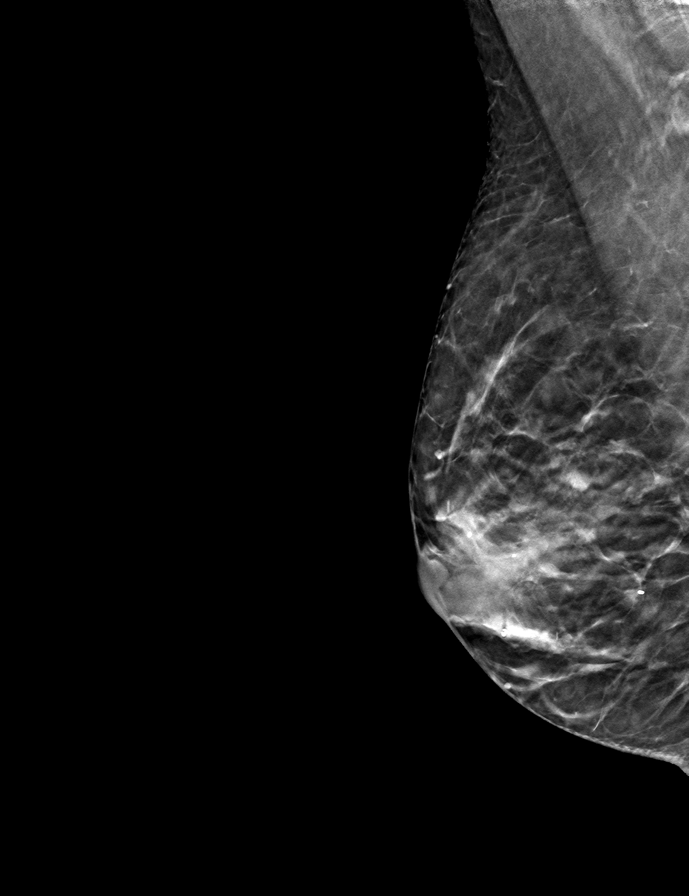

[L MLO tomo · tomo slice 22/43.0]
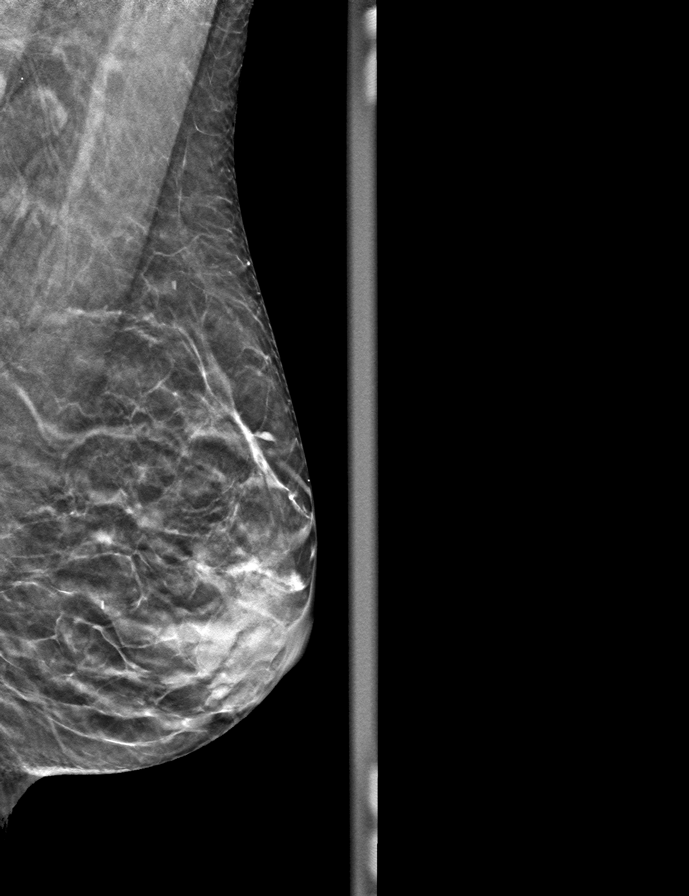

[R CC tomo · tomo slice 21/41.0]
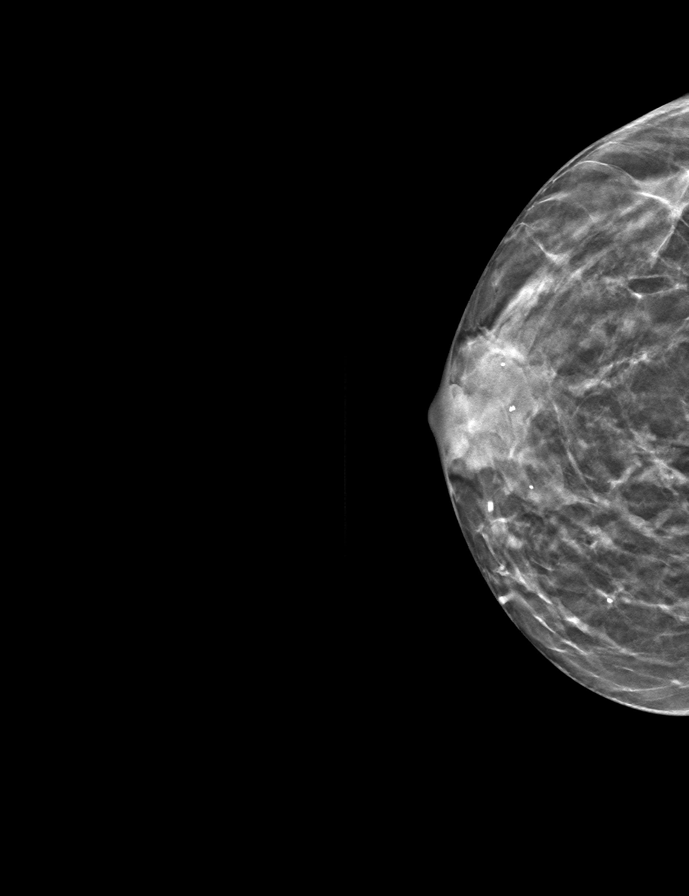

[L CC tomo · tomo slice 21/41.0]
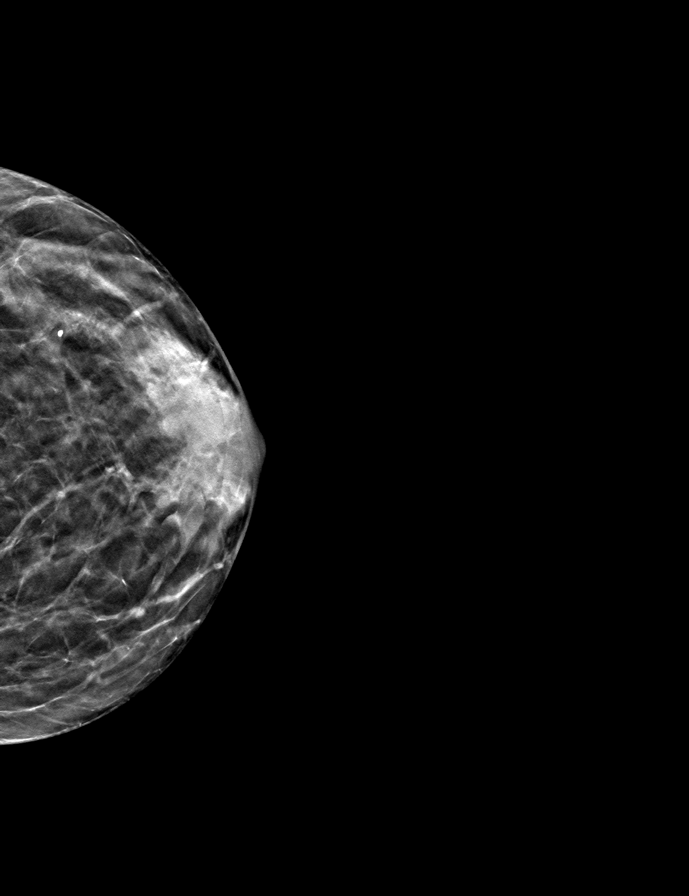

[9 of 24 positions shown; findings below may reference images not displayed]

ACR Breast Density Category c: The breast tissue is heterogeneously
dense, which may obscure small masses.
FINDINGS: There are no findings suspicious for malignancy.
IMPRESSION: No mammographic evidence of malignancy. A result letter of this
screening mammogram will be mailed directly to the patient.

RECOMMENDATION:
Screening mammogram in one year. (Code:[id number])

BI-RADS CATEGORY  1: Negative.

## 2022-10-21 ENCOUNTER — Other Ambulatory Visit: Payer: Self-pay | Admitting: Cardiology

## 2022-10-21 DIAGNOSIS — D225 Melanocytic nevi of trunk: Secondary | ICD-10-CM | POA: Diagnosis not present

## 2022-10-21 DIAGNOSIS — D2261 Melanocytic nevi of right upper limb, including shoulder: Secondary | ICD-10-CM | POA: Diagnosis not present

## 2022-10-21 DIAGNOSIS — L821 Other seborrheic keratosis: Secondary | ICD-10-CM | POA: Diagnosis not present

## 2022-10-21 DIAGNOSIS — Z85828 Personal history of other malignant neoplasm of skin: Secondary | ICD-10-CM | POA: Diagnosis not present

## 2022-11-26 ENCOUNTER — Other Ambulatory Visit: Payer: Self-pay | Admitting: Cardiology

## 2022-12-22 ENCOUNTER — Other Ambulatory Visit: Payer: Self-pay | Admitting: Cardiology

## 2022-12-26 ENCOUNTER — Other Ambulatory Visit: Payer: Self-pay | Admitting: Cardiology

## 2023-04-18 ENCOUNTER — Other Ambulatory Visit: Payer: Self-pay | Admitting: Internal Medicine

## 2023-04-18 DIAGNOSIS — Z1231 Encounter for screening mammogram for malignant neoplasm of breast: Secondary | ICD-10-CM

## 2023-05-16 ENCOUNTER — Ambulatory Visit: Payer: BC Managed Care – PPO

## 2023-05-29 ENCOUNTER — Ambulatory Visit: Payer: BC Managed Care – PPO

## 2023-11-05 ENCOUNTER — Ambulatory Visit

## 2023-11-10 ENCOUNTER — Ambulatory Visit
Admission: RE | Admit: 2023-11-10 | Discharge: 2023-11-10 | Disposition: A | Discharge status: Home / Self Care | Source: Ambulatory Visit | Attending: Internal Medicine

## 2023-11-10 DIAGNOSIS — Z1231 Encounter for screening mammogram for malignant neoplasm of breast: Secondary | ICD-10-CM

## 2024-01-07 DIAGNOSIS — M8589 Other specified disorders of bone density and structure, multiple sites: Secondary | ICD-10-CM | POA: Diagnosis not present

## 2024-01-13 ENCOUNTER — Other Ambulatory Visit: Payer: Self-pay | Admitting: Medical Genetics

## 2024-01-16 ENCOUNTER — Encounter: Payer: Self-pay | Admitting: Cardiology

## 2024-01-16 ENCOUNTER — Ambulatory Visit: Attending: Cardiology | Admitting: Cardiology

## 2024-01-16 VITALS — BP 112/64 | HR 58 | Ht 64.5 in | Wt 133.6 lb

## 2024-01-16 DIAGNOSIS — Z79899 Other long term (current) drug therapy: Secondary | ICD-10-CM

## 2024-01-16 DIAGNOSIS — E7800 Pure hypercholesterolemia, unspecified: Secondary | ICD-10-CM | POA: Diagnosis not present

## 2024-01-16 DIAGNOSIS — R931 Abnormal findings on diagnostic imaging of heart and coronary circulation: Secondary | ICD-10-CM

## 2024-01-16 DIAGNOSIS — I4719 Other supraventricular tachycardia: Secondary | ICD-10-CM | POA: Diagnosis not present

## 2024-01-16 MED ORDER — ATORVASTATIN CALCIUM 40 MG PO TABS: 40.0000 mg | ORAL_TABLET | Freq: Every day | ORAL | 3 refills | Status: AC

## 2024-01-16 NOTE — Patient Instructions (Signed)
 Medication Instructions:  Please INCREASE your dose of atorvastatin  to 40 mg daily.  *If you need a refill on your cardiac medications before your next appointment, please call your pharmacy*  Lab Work: Please complete a FASTING lipid panel and an ALT at any [facility] in 6 weeks.  If you have labs (blood work) drawn today and your tests are completely normal, you will receive your results only by: MyChart Message (if you have MyChart) OR A paper copy in the mail If you have any lab test that is abnormal or we need to change your treatment, we will call you to review the results.  Testing/Procedures:    Please report to Radiology at the [facility] Main Entrance 30 minutes early for your test.  [address] [place], [place] [address]                         OR   Please report to Radiology at [facility] Main Entrance, medical mall, 30 mins prior to your test.  [address]  [place], [place]  How to Prepare for Your Cardiac PET/CT Stress Test:  Nothing to eat or drink, except water, 3 hours prior to arrival time.  NO caffeine/decaffeinated products, or chocolate 12 hours prior to arrival. (Please note decaffeinated beverages (teas/coffees) still contain caffeine).  If you have caffeine within 12 hours prior, the test will need to be rescheduled.  Medication instructions: Do not take erectile dysfunction medications for 72 hours prior to test (sildenafil, tadalafil) Do not take nitrates (isosorbide mononitrate, Ranexa) the day before or day of test   You may take your remaining medications with water.  NO perfume, cologne or lotion on chest or abdomen area. FEMALES - Please avoid wearing dresses to this appointment.  Total time is 1 to 2 hours; you may want to bring reading material for the waiting time.  IF YOU THINK YOU MAY BE PREGNANT, OR ARE NURSING PLEASE INFORM THE TECHNOLOGIST.  In preparation for your appointment, medication  and supplies will be purchased.  Appointment availability is limited, so if you need to cancel or reschedule, please call the Radiology Department Scheduler at [phone] 24 hours in advance to avoid a cancellation fee of $100.00  What to Expect When you Arrive:  Once you arrive and check in for your appointment, you will be taken to a preparation room within the Radiology Department.  A technologist or Nurse will obtain your medical history, verify that you are correctly prepped for the exam, and explain the procedure.  Afterwards, an IV will be started in your arm and electrodes will be placed on your skin for EKG monitoring during the stress portion of the exam. Then you will be escorted to the PET/CT scanner.  There, staff will get you positioned on the scanner and obtain a blood pressure and EKG.  During the exam, you will continue to be connected to the EKG and blood pressure machines.  A small, safe amount of a radioactive tracer will be injected in your IV to obtain a series of pictures of your heart along with an injection of a stress agent.    After your Exam:  It is recommended that you eat a meal and drink a caffeinated beverage to counter act any effects of the stress agent.  Drink plenty of fluids for the remainder of the day and urinate frequently for the first couple of hours after the exam.  Your doctor will inform you of your test results within 7-10 business days.  For more information and frequently asked questions, please visit our website: [email or web address]  For questions about your test or how to prepare for your test, please call: Cardiac Imaging Nurse Navigators Office: [phone]   Follow-Up: At [facility], you and your health needs are our priority.  As part of our continuing mission to provide you with exceptional heart care, our providers are all part of one team.  This team includes your primary Cardiologist (physician) and Advanced  Practice Providers or APPs (Physician Assistants and Nurse Practitioners) who all work together to provide you with the care you need, when you need it.  Your next appointment:   1 year(s)  Provider:   Wilbert Bihari, MD

## 2024-01-16 NOTE — Addendum Note (Signed)
 Addended by: JANIT GENI CROME on: 01/16/2024 05:40 PM   Modules accepted: Orders

## 2024-01-16 NOTE — Progress Notes (Signed)
 Cardiology  Note    Date:  01/16/2024   ID:  FRANCYNE ARREAGA, DOB July 13, 1960, MRN [id number]  PCP:  Vernon Velna SAUNDERS, MD  Cardiologist:  Wilbert Bihari, MD   Chief Complaint  Patient presents with   Follow-up    Coronary artery calcifications, hyperlipidemia, palpitations    History of Present Illness:  Maria Cowan is a 63 y.o. female with a history of hyperlipidemia and family history of premature CAD as well as coronary calcium  score of 111 in 2022-01-25.  Her father died at 52 of CAD.  Her mother also has CAD and received several stents in the past starting in her 24s.  Her mom had atypical symptoms with her CAD.  Her paternal grandfather had CAD as well and she has several cousins on her father side who had MIs and CAD diagnosed in their 28s..   She is here today for follow-up and is doing well.  She denies any chest pain, chest pressure, DOE, PND, orthopnea, lower extremity edema, dizziness, palpitations or syncope.  At her OV in 2022/01/25 she complained of vague left shoulder and neck and jaw pain and was set up for Coronary CTA but had to cancel and it never got rescheduled.  Since she retired her sx have resolved. She goes to they [facility] for exercise and is in an adult active fitness class which is cardio and weights and does it 3 times weekly.    Past Medical History:  Diagnosis Date   Agatston coronary artery calcium  score between 100 and 400 09/05/2021   Basal cell carcinoma    Elevated cholesterol    Family history of coronary arteriosclerosis    Hyperprolactinemia (HCC)    Other hyperfunction of pituitary gland (HCC)    PAT (paroxysmal atrial tachycardia) (HCC)    up to 5 beats in a row on Ziopatch   PVC's (premature ventricular contractions)    noted on Ziopatch 09/2021    Past Surgical History:  Procedure Laterality Date   BASAL CELL CARCINOMA EXCISION     PITUITARY ADENOMA      Current Medications: No outpatient medications have been marked as taking for the 01/16/24  encounter (Office Visit) with Bihari Wilbert SAUNDERS, MD.    Allergies:   Patient has no known allergies.   Social History   Socioeconomic History   Marital status: Married    Spouse name: Not on file   Number of children: Not on file   Years of education: Not on file   Highest education level: Not on file  Occupational History   Not on file  Tobacco Use   Smoking status: Never   Smokeless tobacco: Not on file  Substance and Sexual Activity   Alcohol use: No   Drug use: Never   Sexual activity: Not on file  Other Topics Concern   Not on file  Social History Narrative   Not on file   Social Drivers of Health   Financial Resource Strain: Not on file  Food Insecurity: Not on file  Transportation Needs: Not on file  Physical Activity: Not on file  Stress: Not on file  Social Connections: Not on file     Family History:  The patient's family history includes CAD in her father, mother, and paternal grandfather; Colon polyps in her mother; Hyperlipidemia in her mother; Macular degeneration in her mother.   ROS:   Please see the history of present illness.    ROS All other systems reviewed and are  negative.      No data to display             PHYSICAL EXAM:   VS:  BP 112/64   Pulse (!) 58   Ht 5' 4.5 (1.638 m)   Wt 133 lb 9.6 oz (60.6 kg)   SpO2 99%   BMI 22.58 kg/m    GEN: Well nourished, well developed in no acute distress HEENT: Normal NECK: No JVD; No carotid bruits LYMPHATICS: No lymphadenopathy CARDIAC:RRR, no murmurs, rubs, gallops RESPIRATORY:  Clear to auscultation without rales, wheezing or rhonchi  ABDOMEN: Soft, non-tender, non-distended MUSCULOSKELETAL:  No edema; No deformity  SKIN: Warm and dry NEUROLOGIC:  Alert and oriented x 3 PSYCHIATRIC:  Normal affect  Wt Readings from Last 3 Encounters:  01/16/24 133 lb 9.6 oz (60.6 kg)  09/05/21 132 lb (59.9 kg)      Studies/Labs Reviewed:   EKG Interpretation Date/Time:  Friday January 16 2024 14:30:11 EDT Ventricular Rate:  58 PR Interval:  142 QRS Duration:  90 QT Interval:  418 QTC Calculation: 410 R Axis:   74  Text Interpretation: Sinus bradycardia Possible Left atrial enlargement Nonspecific ST abnormality No previous ECGs available Confirmed by Shlomo Corning ([id number]) on 01/16/2024 2:48:07 PM    Recent Labs: No results found for requested labs within last 365 days.   Lipid Panel    Component Value Date/Time   CHOL 185 11/09/2021 0930   TRIG 42 11/09/2021 0930   HDL 107 11/09/2021 0930   CHOLHDL 1.7 11/09/2021 0930   LDLCALC 69 11/09/2021 0930     ASSESSMENT:    1. Agatston coronary artery calcium  score between 100 and 199   2. Hyperlipidemia LDL goal <70   3. Pure hypercholesterolemia   4. Palpitations      PLAN:  In order of problems listed above:  Coronary artery calcification -Coronary calcium  score was 111 which was 85th percentile for age gender and ethnicity in 2023. -2D echo 09/26/2021 showed EF 60 to 65% -check stress PET CT to rule out ischemia given her coronary Ca with very bad fm hx of CAD at early age -Informed Consent   Shared Decision Making/Informed Consent The risks [chest pain, shortness of breath, cardiac arrhythmias, dizziness, blood pressure fluctuations, myocardial infarction, stroke/transient ischemic attack, nausea, vomiting, allergic reaction, radiation exposure, metallic taste sensation and life-threatening complications (estimated to be 1 in 10,000)], benefits (risk stratification, diagnosing coronary artery disease, treatment guidance) and alternatives of a cardiac PET stress test were discussed in detail with Ms. Jarnagin and she agrees to proceed. -She was counseled on symptoms of angina and to let me know if she develops any symptoms of chest pain or pressure, shortness of breath or exertional fatigue  Hyperlipidemia -LDL goal <70 -I have personally reviewed and interpreted outside labs performed by patient's PCP which  showed LDL 74, HDL 112 and ALT 20 on 11/26/2023 -Increase atorvastatin  to 40 mg daily -Repeat FLP and ALT in 6 weeks  Palpitations - She complained of palpitations at her initial office visit with me -2-week Zio patch showed normal sinus rhythm with 3 episodes of SVT lasting as long as 5 beats in a row, occasional PACs and occasional PVCs with PVC load less than 1% - She has not had any other significant palpitations since I saw her last  Time Spent: 20 minutes total time of encounter, including 15 minutes spent in face-to-face patient care on the date of this encounter. This time includes coordination of care and  counseling regarding above mentioned problem list. Remainder of non-face-to-face time involved reviewing chart documents/testing relevant to the patient encounter and documentation in the medical record. I have independently reviewed documentation from referring provider  Medication Adjustments/Labs and Tests Ordered: Current medicines are reviewed at length with the patient today.  Concerns regarding medicines are outlined above.  Medication changes, Labs and Tests ordered today are listed in the Patient Instructions below.  There are no Patient Instructions on file for this visit.  Follow-up 1 year  Signed, Wilbert Bihari, MD  01/16/2024 2:49 PM    [facility] [address] [place], [place], [place]  [address] Phone: [phone]; Fax: [phone]

## 2024-01-16 NOTE — Addendum Note (Signed)
 Addended by: Rosalinda Seaman L on: 01/16/2024 03:19 PM   Modules accepted: Orders

## 2024-01-18 NOTE — Addendum Note (Signed)
 Addended by: SHLOMO WILBERT SAUNDERS on: 01/18/2024 03:49 PM   Modules accepted: Orders

## 2024-02-05 ENCOUNTER — Other Ambulatory Visit

## 2024-02-05 DIAGNOSIS — Z006 Encounter for examination for normal comparison and control in clinical research program: Secondary | ICD-10-CM

## 2024-02-14 LAB — GENECONNECT MOLECULAR SCREEN: Genetic Analysis Overall Interpretation: NEGATIVE

## 2024-02-16 ENCOUNTER — Encounter (HOSPITAL_COMMUNITY): Payer: Self-pay

## 2024-02-18 ENCOUNTER — Encounter (HOSPITAL_COMMUNITY)

## 2024-03-15 ENCOUNTER — Encounter (HOSPITAL_COMMUNITY): Payer: Self-pay

## 2024-03-17 ENCOUNTER — Encounter (HOSPITAL_COMMUNITY): Admission: RE | Admit: 2024-03-17 | Source: Ambulatory Visit

## 2024-05-03 ENCOUNTER — Encounter (HOSPITAL_COMMUNITY): Payer: Self-pay

## 2024-05-05 ENCOUNTER — Encounter (HOSPITAL_COMMUNITY)
Admission: RE | Admit: 2024-05-05 | Discharge: 2024-05-05 | Disposition: A | Discharge status: Home / Self Care | Source: Ambulatory Visit | Attending: Cardiology | Admitting: Cardiology

## 2024-05-05 DIAGNOSIS — R931 Abnormal findings on diagnostic imaging of heart and coronary circulation: Secondary | ICD-10-CM | POA: Diagnosis present

## 2024-05-05 DIAGNOSIS — I2510 Atherosclerotic heart disease of native coronary artery without angina pectoris: Secondary | ICD-10-CM | POA: Insufficient documentation

## 2024-05-05 DIAGNOSIS — E7800 Pure hypercholesterolemia, unspecified: Secondary | ICD-10-CM | POA: Diagnosis present

## 2024-05-05 DIAGNOSIS — I2584 Coronary atherosclerosis due to calcified coronary lesion: Secondary | ICD-10-CM

## 2024-05-05 LAB — NM PET CT CARDIAC PERFUSION MULTI W/ABSOLUTE BLOODFLOW
LV dias vol: 71 mL (ref 46–106)
MBFR: 2.87
Nuc Stress EF: 70 %
Peak HR: 125 {beats}/min
Rest HR: 96 {beats}/min
Rest MBF: 1.06 ml/g/min
Rest Nuclear Isotope Dose: 15.8 mCi
ST Depression (mm): 0 mm
Stress MBF: 3.04 ml/g/min
Stress Nuclear Isotope Dose: 15.8 mCi
TID: 0.95

## 2024-05-05 MED ORDER — REGADENOSON 0.4 MG/5ML IV SOLN
0.4000 mg | Freq: Once | INTRAVENOUS | Status: AC
  Administered 2024-05-05: 0.4 mg via INTRAVENOUS

## 2024-05-05 MED ORDER — RUBIDIUM RB82 GENERATOR (RUBYFILL)
15.7500 | PACK | Freq: Once | INTRAVENOUS | Status: AC
  Administered 2024-05-05: 15.75 via INTRAVENOUS

## 2024-05-05 MED ORDER — RUBIDIUM RB82 GENERATOR (RUBYFILL)
15.7900 | PACK | Freq: Once | INTRAVENOUS | Status: AC
  Administered 2024-05-05: 15.79 via INTRAVENOUS

## 2024-05-05 MED ORDER — REGADENOSON 0.4 MG/5ML IV SOLN
INTRAVENOUS | Status: AC
  Filled 2024-05-05: qty 5

## 2024-05-07 ENCOUNTER — Ambulatory Visit: Payer: Self-pay | Admitting: Cardiology
